# Patient Record
Sex: Male | Born: 1990 | Race: White | Hispanic: No | Marital: Single | Smoking: Current some day smoker
Health system: Southern US, Community
[De-identification: ages and names within clinical notes are randomized; demographics above are authoritative.]

## PROBLEM LIST (undated history)

## (undated) DIAGNOSIS — N200 Calculus of kidney: Secondary | ICD-10-CM

## (undated) DIAGNOSIS — I10 Essential (primary) hypertension: Secondary | ICD-10-CM

## (undated) DIAGNOSIS — F988 Other specified behavioral and emotional disorders with onset usually occurring in childhood and adolescence: Secondary | ICD-10-CM

## (undated) HISTORY — DX: Essential (primary) hypertension: I10

## (undated) HISTORY — DX: Other specified behavioral and emotional disorders with onset usually occurring in childhood and adolescence: F98.8

---
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
## 2010-05-05 ENCOUNTER — Emergency Department (HOSPITAL_COMMUNITY): Admission: EM | Admit: 2010-05-05 | Discharge: 2010-05-06 | Payer: Self-pay | Admitting: Emergency Medicine

## 2010-05-05 ENCOUNTER — Ambulatory Visit: Payer: Self-pay | Admitting: Family Medicine

## 2010-05-28 ENCOUNTER — Ambulatory Visit: Payer: Self-pay | Admitting: Family Medicine

## 2010-11-07 LAB — RAPID URINE DRUG SCREEN, HOSP PERFORMED
Barbiturates: NOT DETECTED
Cocaine: NOT DETECTED
Opiates: NOT DETECTED

## 2010-11-07 LAB — COMPREHENSIVE METABOLIC PANEL
ALT: 26 U/L (ref 0–53)
AST: 29 U/L (ref 0–37)
Alkaline Phosphatase: 74 U/L (ref 39–117)
CO2: 25 mEq/L (ref 19–32)
GFR calc non Af Amer: >60 mL/min (ref >60)
Glucose, Bld: 147 mg/dL — ABNORMAL HIGH (ref 70–99)
Potassium: 3.2 mEq/L — ABNORMAL LOW (ref 3.5–5.1)
Sodium: 132 mEq/L — ABNORMAL LOW (ref 135–145)
Total Protein: 7.7 g/dL (ref 6.0–8.3)

## 2010-11-07 LAB — URINALYSIS, ROUTINE W REFLEX MICROSCOPIC
Bilirubin Urine: NEGATIVE
Nitrite: NEGATIVE
Specific Gravity, Urine: 1.009 (ref 1.005–1.030)
Urobilinogen, UA: 0.2 mg/dL (ref 0.0–1.0)
pH: 6 (ref 5.0–8.0)

## 2010-11-07 LAB — CBC
MCV: 81.9 fL (ref 78.0–100.0)
Platelets: 295 10*3/uL (ref 150–400)
RBC: 5.25 MIL/uL (ref 4.22–5.81)
RDW: 12.6 % (ref 11.5–15.5)
WBC: 19.6 10*3/uL — ABNORMAL HIGH (ref 4.0–10.5)

## 2010-11-07 LAB — PROTIME-INR: Prothrombin Time: 12.1 seconds (ref 11.6–15.2)

## 2010-11-07 LAB — POCT CARDIAC MARKERS
CKMB, poc: <1 ng/mL — ABNORMAL LOW (ref 1.0–8.0)
Myoglobin, poc: 96.1 ng/mL (ref 12–200)
Troponin i, poc: <0.05 ng/mL (ref 0.00–0.09)
Troponin i, poc: <0.05 ng/mL (ref 0.00–0.09)

## 2010-11-07 LAB — POCT I-STAT, CHEM 8
BUN: 12 mg/dL (ref 6–23)
Chloride: 104 mEq/L (ref 96–112)
Creatinine, Ser: 1.2 mg/dL (ref 0.4–1.5)
Sodium: 138 mEq/L (ref 135–145)
TCO2: 24 mmol/L (ref 0–100)

## 2010-12-26 ENCOUNTER — Ambulatory Visit: Payer: Self-pay | Admitting: Family Medicine

## 2010-12-27 ENCOUNTER — Ambulatory Visit (INDEPENDENT_AMBULATORY_CARE_PROVIDER_SITE_OTHER): Payer: Managed Care, Other (non HMO) | Admitting: Medical

## 2010-12-27 DIAGNOSIS — J209 Acute bronchitis, unspecified: Secondary | ICD-10-CM

## 2010-12-27 DIAGNOSIS — F17200 Nicotine dependence, unspecified, uncomplicated: Secondary | ICD-10-CM

## 2011-04-03 ENCOUNTER — Other Ambulatory Visit: Payer: Self-pay | Admitting: *Deleted

## 2011-04-03 MED ORDER — LISINOPRIL 10 MG PO TABS: 10.0000 mg | ORAL_TABLET | Freq: Every day | ORAL | Status: DC

## 2011-04-03 NOTE — Telephone Encounter (Signed)
Called in Lisinopril 10 mg #30 with no refills to [facility] at [phone].  Pt has an appointment for an office visit 04-10-11 at 10 am.  CM, LPN

## 2011-04-09 ENCOUNTER — Encounter: Payer: Self-pay | Admitting: Family Medicine

## 2011-04-10 ENCOUNTER — Encounter: Payer: Managed Care, Other (non HMO) | Admitting: Family Medicine

## 2011-04-24 ENCOUNTER — Encounter: Payer: Self-pay | Admitting: Family Medicine

## 2011-04-24 ENCOUNTER — Ambulatory Visit (INDEPENDENT_AMBULATORY_CARE_PROVIDER_SITE_OTHER): Payer: Managed Care, Other (non HMO) | Admitting: Family Medicine

## 2011-04-24 VITALS — BP 120/80 | HR 70 | Wt 200.0 lb

## 2011-04-24 DIAGNOSIS — R030 Elevated blood-pressure reading, without diagnosis of hypertension: Secondary | ICD-10-CM

## 2011-04-24 NOTE — Patient Instructions (Signed)
Stop the lisinopril. Start an exercise program and make some dietary adjustments. Check her blood pressure once a week and return here with those readings in about 2 months

## 2011-04-24 NOTE — Progress Notes (Signed)
  Subjective:    Patient ID: Colin Sparks, male    DOB: [birth year], 20 y.o.   MRN: [id number]  HPI He is here for recheck on his blood pressure. He is on lisinopril however review of the record indicates he was placed on this medication after blood pressure readings over a two-week interval. He presently is having no difficulties. He admits to very little lifestyle modification in regard to diet and exercise.   Review of Systems     Objective:   Physical Exam Alert and in no distress. Blood pressure is recorded.      Assessment & Plan:  Elevated blood pressure. Doubt true hypertension. I again discussed the need for him to make diet and exercise changes. He'll stop his lisinopril and monitor his blood pressure weekly. Return here in 2 months for recheck.

## 2011-07-08 ENCOUNTER — Ambulatory Visit (INDEPENDENT_AMBULATORY_CARE_PROVIDER_SITE_OTHER): Payer: Managed Care, Other (non HMO) | Admitting: Family Medicine

## 2011-07-08 ENCOUNTER — Encounter: Payer: Self-pay | Admitting: Family Medicine

## 2011-07-08 VITALS — BP 130/90 | HR 70 | Wt 205.0 lb

## 2011-07-08 DIAGNOSIS — R030 Elevated blood-pressure reading, without diagnosis of hypertension: Secondary | ICD-10-CM

## 2011-07-08 NOTE — Progress Notes (Signed)
  Subjective:    Patient ID: Colin Sparks, male    DOB: 1991/07/22, 20 y.o.   MRN: [id number]  HPI He is here for blood pressure recheck. He has not made any diet or exercise changes.   Review of Systems     Objective:   Physical Exam  Alert and in no distress. Blood pressure is recorded      Assessment & Plan:  Elevated blood pressure.   I discussed diet and exercise with him and told him that I really did not want to place him on blood pressure medicine at this time. He is agreeable with this. He will attempt to make changes in his lifestyle. Return here in 3 months for recheck.

## 2011-07-08 NOTE — Patient Instructions (Signed)
Make some dietary changes and start an exercise regimen. Easiest thing to do with the diet is to cut back on carbs

## 2011-07-21 ENCOUNTER — Ambulatory Visit (INDEPENDENT_AMBULATORY_CARE_PROVIDER_SITE_OTHER): Payer: Managed Care, Other (non HMO) | Admitting: Medical

## 2011-07-21 ENCOUNTER — Encounter: Payer: Self-pay | Admitting: Medical

## 2011-07-21 DIAGNOSIS — R05 Cough: Secondary | ICD-10-CM

## 2011-07-21 DIAGNOSIS — R509 Fever, unspecified: Secondary | ICD-10-CM

## 2011-07-21 DIAGNOSIS — I10 Essential (primary) hypertension: Secondary | ICD-10-CM

## 2011-07-21 DIAGNOSIS — J111 Influenza due to unidentified influenza virus with other respiratory manifestations: Secondary | ICD-10-CM | POA: Insufficient documentation

## 2011-07-21 DIAGNOSIS — R059 Cough, unspecified: Secondary | ICD-10-CM

## 2011-07-21 NOTE — Progress Notes (Addendum)
Subjective:   HPI  Colin Sparks is a 20 y.o. male who presents with 2 to 3 day history headache, deep cough, chills, fever up to 102.6, chest congestion, and yellow sputum. This came on abruptly just a few days ago, runny nose, feels awful, feels achy all over.  Denies sick contacts.  No sore throat, ear pain, nausea or vomiting, or other symptoms.  No other aggravating or relieving factors.    No other c/o.  The following portions of the patient's history were reviewed and updated as appropriate: allergies, current medications, past family history, past medical history, past social history, past surgical history and problem list.  Past Medical History  Diagnosis Date  . ADD (attention deficit disorder)   . Hypertension     Review of Systems Constitutional: +fever, +chills, +sweats, -unexpected -weight change,-fatigue ENT: +runny nose, -ear pain, -sore throat Cardiology:  -chest pain, -palpitations, -edema Respiratory: +cough, -shortness of breath, -wheezing Gastroenterology: -abdominal pain, -nausea, -vomiting, -diarrhea, -constipation Hematology: -bleeding or bruising problems Musculoskeletal: -arthralgias, -myalgias, -joint swelling, -back pain Ophthalmology: -vision changes Urology: -dysuria, -difficulty urinating, -hematuria, -urinary frequency, -urgency Neurology: +headache, -weakness, -tingling, -numbness   Objective:   Filed Vitals:   07/21/11 1511  BP: 158/110  Pulse: 68  Temp: 98.4 F (36.9 C)    General appearance: Alert, WD/WN, no distress, ill appearing                             Skin: warm, flushed, somewhat diaphoretic                           Head: no sinus tenderness                            Eyes: conjunctiva normal, corneas clear, PERRLA                            Ears: pearly TMs, external ear canals normal                          Nose: septum midline, turbinates swollen, with erythema and clear discharge             Mouth/throat: MMM, tongue  normal, mild pharyngeal erythema                           Neck: supple, no adenopathy, no thyromegaly, nontender                          Heart: RRR, normal S1, S2, no murmurs                         Lungs: +bronchial breath sounds, no rhonchi, no wheezes, no rales                Extremities: no edema, nontender     Assessment and Plan:   Encounter Diagnoses  Name Primary?  . Influenza Yes  . Fever   . Cough   . Hypertension    Influenza A+.  Discussed diagnosis and treatment of influenza, contagious nature of illness, usual course, and possible complications.   Discussed supportive care, prevention.  Tylenol or Ibuprofen OTC for fever and malaise.  Wrote note for work, call/return if worse or not improving.  Call  Report 3-4 days. Handout given. Recheck on blood pressure in 1 week.

## 2011-07-21 NOTE — Patient Instructions (Signed)
Influenza, Adult Influenza ("the flu") is a viral infection of the respiratory tract. It causes chills, fever, cough, headache, body aches, and sore throat. Influenza in general will make you feel sicker than when you have a common cold. Symptoms of the illness typically last a few days. Cough and fatigue may continue for as long as 7 to 10 days. Influenza is highly contagious. It spreads easily to others in the droplets from coughs and sneezes. People frequently become infected by touching something that was recently contaminated with the virus and then touch their mouth, nose or eyes. This infection is caused by a virus. Symptoms will not be reduced or improved by taking an antibiotic. Antibiotics are medications that kill bacteria, not viruses. DIAGNOSIS  Diagnosis of influenza is often made based on the history and physical examination as well as the presence of influenza reports occurring in your community. Testing can be done if the diagnosis is not certain. TREATMENT  Since influenza is caused by a virus, antibiotics are not helpful. Your caregiver may prescribe antiviral medicines to shorten the illness and lessen the severity. Your caregiver may also recommend influenza vaccination and/or antiviral medicines for your family members in order to prevent the spread of influenza to them. HOME CARE INSTRUCTIONS  DO NOT GIVE ASPIRIN TO PERSONS WITH INFLUENZA WHO ARE UNDER 18 YEARS OF AGE. This could lead to brain and liver damage (Reye's syndrome). Read the label on over-the-counter medicines.   Stay home from work or school if at all possible until most of your symptoms are gone.   Only take over-the-counter or prescription medicines for pain, discomfort, or fever as directed by your caregiver.   Use a cool mist humidifier to increase air moisture. This will make breathing easier.   Rest until your temperature is nearly normal: 98.6 F (37 C). This usually takes 3 to 4 days. Be sure you get  plenty of rest.   Drink at least eight, eight-ounce glasses of fluids per day. Fluids include water, juice, broth, gelatin, or lemonade.   Cover your mouth and nose when coughing or sneezing and wash your hands often to prevent the spread of this virus to other persons.  PREVENTION  Annual influenza vaccination (flu shots) is the best way to avoid getting influenza. An annual flu shot is now routinely recommended for all adults in the [place] SEEK MEDICAL CARE IF:   You develop shortness of breath while resting.   You have a deep cough with production of mucous or chest pain.   You develop nausea (feeling sick to your stomach), vomiting, or diarrhea.  SEEK IMMEDIATE MEDICAL CARE IF:   You have difficulty breathing, become short of breath, or your skin or nails turn bluish.   You develop severe neck pain or stiffness.   You develop a severe headache, facial pain, or earache.   You have a fever.   You develop nausea or vomiting that cannot be controlled.  Document Released: 08/08/2000 Document Revised: 04/23/2011 Document Reviewed: 06/13/2009 [employer] Patient Information 2012 [employer]. 

## 2011-07-22 LAB — POCT INFLUENZA A/B
Influenza A, POC: POSITIVE
Influenza B, POC: NEGATIVE

## 2011-07-22 NOTE — Progress Notes (Signed)
Addended by: Janeice Robinson on: 07/22/2011 04:21 PM   Modules accepted: Orders

## 2011-10-08 ENCOUNTER — Ambulatory Visit: Payer: Managed Care, Other (non HMO) | Admitting: Family Medicine

## 2011-12-11 ENCOUNTER — Ambulatory Visit (INDEPENDENT_AMBULATORY_CARE_PROVIDER_SITE_OTHER): Payer: BC Managed Care – PPO | Admitting: Medical

## 2011-12-11 ENCOUNTER — Encounter: Payer: Self-pay | Admitting: Medical

## 2011-12-11 VITALS — BP 160/100 | HR 80 | Temp 98.1°F | Wt 200.0 lb

## 2011-12-11 DIAGNOSIS — S99929A Unspecified injury of unspecified foot, initial encounter: Secondary | ICD-10-CM

## 2011-12-11 DIAGNOSIS — M25579 Pain in unspecified ankle and joints of unspecified foot: Secondary | ICD-10-CM

## 2011-12-11 DIAGNOSIS — Z720 Tobacco use: Secondary | ICD-10-CM

## 2011-12-11 DIAGNOSIS — I10 Essential (primary) hypertension: Secondary | ICD-10-CM | POA: Insufficient documentation

## 2011-12-11 DIAGNOSIS — S99919A Unspecified injury of unspecified ankle, initial encounter: Secondary | ICD-10-CM

## 2011-12-11 DIAGNOSIS — F17200 Nicotine dependence, unspecified, uncomplicated: Secondary | ICD-10-CM

## 2011-12-11 MED ORDER — NAPROXEN 500 MG PO TABS: 500.0000 mg | ORAL_TABLET | Freq: Two times a day (BID) | ORAL | Status: DC

## 2011-12-11 MED ORDER — LISINOPRIL-HYDROCHLOROTHIAZIDE 10-12.5 MG PO TABS: 1.0000 | ORAL_TABLET | Freq: Every day | ORAL | Status: DC

## 2011-12-11 NOTE — Patient Instructions (Signed)
Ankle sprain - RICE - rest, ice compression and elevation  Rest by staying off the ankle, use crutches when walking  Ice 2-4 times daily  Compression with ACE wrap or sleeve  Elevate the leg  Begin Naprosyn 500mg  twice daily the next 5-7 days for pain and inflammation  In 5-7 days as pain subsides, start doing towel exercises   Hypertension - we will start you on medication today to control your blood pressure.  The medication is Lisinopril/HCT, once daily.  I want to see you back in 10-14 days to recheck both ankle and blood pressure and we will need to do some other tests at this time.     Hypertension As your heart beats, it forces blood through your arteries. This force is your blood pressure. If the pressure is too high, it is called hypertension (HTN) or high blood pressure. HTN is dangerous because you may have it and not know it. High blood pressure may mean that your heart has to work harder to pump blood. Your arteries may be narrow or stiff. The extra work puts you at risk for heart disease, stroke, and other problems.  Blood pressure consists of two numbers, a higher number over a lower, 110/72, for example. It is stated as "110 over 72." The ideal is below 120 for the top number (systolic) and under 80 for the bottom (diastolic). Write down your blood pressure today. You should pay close attention to your blood pressure if you have certain conditions such as:  Heart failure.   Prior heart attack.   Diabetes   Chronic kidney disease.   Prior stroke.   Multiple risk factors for heart disease.  To see if you have HTN, your blood pressure should be measured while you are seated with your arm held at the level of the heart. It should be measured at least twice. A one-time elevated blood pressure reading (especially in the Emergency Department) does not mean that you need treatment. There may be conditions in which the blood pressure is different between your right and left  arms. It is important to see your caregiver soon for a recheck. Most people have essential hypertension which means that there is not a specific cause. This type of high blood pressure may be lowered by changing lifestyle factors such as:  Stress.   Smoking.   Lack of exercise.   Excessive weight.   Drug/tobacco/alcohol use.   Eating less salt.  Most people do not have symptoms from high blood pressure until it has caused damage to the body. Effective treatment can often prevent, delay or reduce that damage. TREATMENT  When a cause has been identified, treatment for high blood pressure is directed at the cause. There are a large number of medications to treat HTN. These fall into several categories, and your caregiver will help you select the medicines that are best for you. Medications may have side effects. You should review side effects with your caregiver. If your blood pressure stays high after you have made lifestyle changes or started on medicines,   Your medication(s) may need to be changed.   Other problems may need to be addressed.   Be certain you understand your prescriptions, and know how and when to take your medicine.   Be sure to follow up with your caregiver within the time frame advised (usually within two weeks) to have your blood pressure rechecked and to review your medications.   If you are taking more than one  medicine to lower your blood pressure, make sure you know how and at what times they should be taken. Taking two medicines at the same time can result in blood pressure that is too low.  SEEK IMMEDIATE MEDICAL CARE IF:  You develop a severe headache, blurred or changing vision, or confusion.   You have unusual weakness or numbness, or a faint feeling.   You have severe chest or abdominal pain, vomiting, or breathing problems.  MAKE SURE YOU:   Understand these instructions.   Will watch your condition.   Will get help right away if you are not  doing well or get worse.  Document Released: 08/11/2005 Document Revised: 07/31/2011 Document Reviewed: 03/31/2008 [facility] Patient Information 2012 [place], [place].

## 2011-12-11 NOTE — Progress Notes (Signed)
Subjective: Here for left ankle pain.  Stepped off 3 ft porch yesterday, landed awkward on left foot, felt pain and since has had pain, swelling, can't walk much on the foot today.  He typically favors left foot due to high school right ankle injury.  Pain over left ankle outside and arch of foot.  Using OTC NSAID for pain.   No Known Allergies  Current Outpatient Prescriptions on File Prior to Visit  Medication Sig Dispense Refill  . lisinopril-hydrochlorothiazide (PRINZIDE,ZESTORETIC) 10-12.5 MG per tablet Take 1 tablet by mouth daily.  30 tablet  3    Past Medical History  Diagnosis Date  . ADD (attention deficit disorder)   . Hypertension     History reviewed. No pertinent past surgical history.  Family History  Problem Relation Age of Onset  . Arthritis Father   . Heart disease Father   . Gout Father   . Mental illness Maternal Aunt   . Mental illness Paternal Aunt   . Cancer Paternal Uncle   . Diabetes Maternal Grandmother   . Hypertension Maternal Grandmother   . Heart disease Maternal Grandfather   . Heart disease Paternal Grandmother   . Hypertension Paternal Grandmother   . Cancer Paternal Grandfather   . Diabetes Paternal Grandfather   . Hypertension Paternal Grandfather     History   Social History  . Marital Status: Single    Spouse Name: N/A    Number of Children: N/A  . Years of Education: N/A   Occupational History  . Not on file.   Social History Main Topics  . Smoking status: Current Everyday Smoker  . Smokeless tobacco: Never Used  . Alcohol Use: Not on file  . 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 Sexually Active: Not on file   Other Topics Concern  . Not on file   Social History Narrative  . No narrative on file    Reviewed their medical, surgical, family, social, medication, and allergy history and updated chart as appropriate.    Review of Systems Constitutional: -fever, -chills, -sweats,  -unexpected -weight change,-fatigue Cardiology:  -chest pain, -palpitations, -edema Respiratory: -cough, -shortness of breath, -wheezing Gastroenterology: -abdominal pain, -nausea, -vomiting, -diarrhea, -constipation Ophthalmology: -vision changes Urology: -dysuria, -difficulty urinating, -hematuria, -urinary frequency, -urgency Neurology: -headache, -weakness, -tingling, -numbness    Objective:   Physical Exam  Filed Vitals:   12/11/11 1531  BP: 160/100  Pulse: 80  Temp: 98.1 F (36.7 C)    General appearance: alert, no distress, WD/WN, overweight white male, flushed in face as usual Oral cavity: MMM, no lesions Neck: supple, no lymphadenopathy, no thyromegaly, no masses, no bruit Heart: RRR, normal S1, S2, no murmurs Lungs: CTA bilaterally, no wheezes, rhonchi, or rales Abdomen: +bs, soft, non tender, non distended, no masses, no hepatomegaly, no splenomegaly Pulses: 2+ symmetric, upper and lower extremities, normal cap refill MSK: tender left lateral ankle over malleolus, swelling generalized of lateral ankle, tender ATFL, decreased eversion, otherwise bilat foot/ankle exam unremarkable Neuro: nonfocal, intact   Assessment and Plan :    Encounter Diagnoses  Name Primary?  . Essential hypertension, benign Yes  . Ankle pain   . Ankle injury   . Tobacco use    HTN - based on today and past BP readings, will begin medication today.  Lisinopril/HCT.  Recheck 2wk, labs and further eval at that time.  Discussed risks of uncontrolled HTN.   Ankle pain - ankle  sprain.  Xray negative for fracture of abnormality.   Advised RICE, crutches, script for naprosyn, discussed towel exercise once pain and swelling subsides in a week, and recheck 2 wk.  Tobacco use - Patient was encouraged to quit smoking.  Discussed risks of smoking.    Follow-up  2wk

## 2011-12-24 ENCOUNTER — Ambulatory Visit (INDEPENDENT_AMBULATORY_CARE_PROVIDER_SITE_OTHER): Payer: BC Managed Care – PPO | Admitting: Medical

## 2011-12-24 ENCOUNTER — Encounter: Payer: Self-pay | Admitting: Medical

## 2011-12-24 VITALS — BP 142/70 | HR 78 | Temp 98.2°F | Resp 16 | Wt 201.0 lb

## 2011-12-24 DIAGNOSIS — I10 Essential (primary) hypertension: Secondary | ICD-10-CM

## 2011-12-24 DIAGNOSIS — F1910 Other psychoactive substance abuse, uncomplicated: Secondary | ICD-10-CM

## 2011-12-24 DIAGNOSIS — Z720 Tobacco use: Secondary | ICD-10-CM

## 2011-12-24 DIAGNOSIS — E663 Overweight: Secondary | ICD-10-CM | POA: Insufficient documentation

## 2011-12-24 DIAGNOSIS — F1911 Other psychoactive substance abuse, in remission: Secondary | ICD-10-CM

## 2011-12-24 DIAGNOSIS — F17200 Nicotine dependence, unspecified, uncomplicated: Secondary | ICD-10-CM

## 2011-12-24 NOTE — Progress Notes (Signed)
Subjective:   HPI  Colin Sparks is a 21 y.o. male who presents for 2 wk recheck on ankle and blood pressure.  Last visit we diagnosed him with hypertension, started him on medication.  No current c/o or medication side effects. He is non fasting today. His ankle is much improved with rest, elevation, ice, and he is doing towel rehab exercises.    He does snore but no hx/o apnea.  He is still smoking.  No other aggravating or relieving factors.    No other c/o.  The following portions of the patient's history were reviewed and updated as appropriate: allergies, current medications, past family history, past medical history, past social history, past surgical history and problem list.  Past Medical History  Diagnosis Date  . ADD (attention deficit disorder)   . Hypertension     No Known Allergies   Review of Systems ROS reviewed and was negative other than noted in HPI or above.    Objective:   Physical Exam  General appearance: alert, no distress, WD/WN, overweight white male Skin: always appears flushed in the face Neck: supple, no lymphadenopathy, no thyromegaly, no masses, no bruits Heart: RRR, normal S1, S2, no murmurs Lungs: CTA bilaterally, no wheezes, rhonchi, or rales Abdomen: +bs, soft, non tender, non distended, no masses, no hepatomegaly, no splenomegaly, no bruits Pulses: 2+ symmetric, upper and lower extremities, normal cap refill   Adult ECG Report  Indication: new onset hypertension in 20yo  Rate: 72bpm  Rhythm: normal sinus rhythm  QRS Axis: 54 degrees  PR Interval:  QRS Duration: 94ms  QTc:  Conduction Disturbances: none  Other Abnormalities: none  Patient's cardiac risk factors are: hypertension and male gender.  EKG comparison: none  Narrative Interpretation: nonspecific T wave abnormality lead III, otherwise normal EKG   Assessment and Plan :     Encounter Diagnoses  Name Primary?  . Essential hypertension, benign Yes  .  Overweight   . Tobacco use disorder   . History of substance abuse    Discussed risk of HTN, discussed secondary causes, possible workup.  He will return for fasting labs.  C/t Lisinopril HCT started 2 wk ago.  May need cardiology consult, but we will get labs next.

## 2011-12-24 NOTE — Patient Instructions (Signed)

## 2011-12-25 ENCOUNTER — Encounter: Payer: Self-pay | Admitting: Medical

## 2011-12-31 ENCOUNTER — Other Ambulatory Visit: Payer: BC Managed Care – PPO

## 2012-04-20 ENCOUNTER — Encounter: Payer: Self-pay | Admitting: Family Medicine

## 2012-04-20 ENCOUNTER — Ambulatory Visit (INDEPENDENT_AMBULATORY_CARE_PROVIDER_SITE_OTHER): Payer: BC Managed Care – PPO | Admitting: Family Medicine

## 2012-04-20 VITALS — BP 130/80 | HR 70 | Wt 208.0 lb

## 2012-04-20 DIAGNOSIS — I10 Essential (primary) hypertension: Secondary | ICD-10-CM

## 2012-04-20 DIAGNOSIS — E663 Overweight: Secondary | ICD-10-CM

## 2012-04-20 MED ORDER — LISINOPRIL-HYDROCHLOROTHIAZIDE 10-12.5 MG PO TABS: 1.0000 | ORAL_TABLET | Freq: Every day | ORAL | Status: DC

## 2012-04-20 NOTE — Progress Notes (Signed)
  Subjective:    Patient ID: Colin Sparks, male    DOB: [birth year], 21 y.o.   MRN: [id number]  HPI He is here for medication check. He continues on blood pressure medication. He has recently made major changes in his life and is now starting a diet and exercise program. He has cut out alcohol, drugs of all kinds. 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 Continues in recovery and did have a recent setback that now is clean again.   Review of Systems     Objective:   Physical Exam Alert and in no distress. Blood pressure is recorded       Assessment & Plan:   1. Essential hypertension, benign  lisinopril-hydrochlorothiazide (PRINZIDE,ZESTORETIC) 10-12.5 MG per tablet  2. Overweight     I encouraged him to continue with his diet and exercise regimen. Also encouraged him to stay in the recovery program. Discussed weight loss with him and recommend he get down to a waist size of 30-32. Discussed possibly stopping his blood pressure medicine if he loses approximately 20 pounds but encouraged him to continue with that weight loss.

## 2012-08-20 ENCOUNTER — Ambulatory Visit
Admission: RE | Admit: 2012-08-20 | Discharge: 2012-08-20 | Disposition: A | Discharge status: Home / Self Care | Payer: BC Managed Care – PPO | Source: Ambulatory Visit | Attending: Medical | Admitting: Medical

## 2012-08-20 ENCOUNTER — Ambulatory Visit (INDEPENDENT_AMBULATORY_CARE_PROVIDER_SITE_OTHER): Payer: BC Managed Care – PPO | Admitting: Medical

## 2012-08-20 ENCOUNTER — Telehealth: Payer: Self-pay | Admitting: Family Medicine

## 2012-08-20 ENCOUNTER — Other Ambulatory Visit: Payer: Self-pay | Admitting: Medical

## 2012-08-20 ENCOUNTER — Encounter: Payer: Self-pay | Admitting: Medical

## 2012-08-20 VITALS — BP 130/80 | HR 57 | Temp 97.6°F | Wt 212.0 lb

## 2012-08-20 DIAGNOSIS — N23 Unspecified renal colic: Secondary | ICD-10-CM

## 2012-08-20 DIAGNOSIS — R112 Nausea with vomiting, unspecified: Secondary | ICD-10-CM

## 2012-08-20 LAB — BASIC METABOLIC PANEL
BUN: 15 mg/dL (ref 6–23)
Chloride: 99 mEq/L (ref 96–112)
Creat: 0.94 mg/dL (ref 0.50–1.35)

## 2012-08-20 LAB — CBC WITH DIFFERENTIAL/PLATELET
Basophils Absolute: 0.1 10*3/uL (ref 0.0–0.1)
Basophils Relative: 0 % (ref 0–1)
Eosinophils Relative: 2 % (ref 0–5)
HCT: 46 % (ref 39.0–52.0)
Lymphocytes Relative: 14 % (ref 12–46)
MCHC: 36.3 g/dL — ABNORMAL HIGH (ref 30.0–36.0)
Monocytes Absolute: 0.9 10*3/uL (ref 0.1–1.0)
Neutro Abs: 12.9 10*3/uL — ABNORMAL HIGH (ref 1.7–7.7)
Platelets: 327 10*3/uL (ref 150–400)
RDW: 13.6 % (ref 11.5–15.5)
WBC: 16.3 10*3/uL — ABNORMAL HIGH (ref 4.0–10.5)

## 2012-08-20 LAB — POCT URINALYSIS DIPSTICK
Leukocytes, UA: NEGATIVE
Urobilinogen, UA: NEGATIVE
pH, UA: 5

## 2012-08-20 MED ORDER — PROMETHAZINE HCL 25 MG PO TABS: 25.0000 mg | ORAL_TABLET | Freq: Four times a day (QID) | ORAL | Status: DC | PRN

## 2012-08-20 MED ORDER — HYDROCODONE-ACETAMINOPHEN 7.5-325 MG PO TABS: 1.0000 | ORAL_TABLET | Freq: Four times a day (QID) | ORAL | Status: DC | PRN

## 2012-08-20 MED ORDER — TAMSULOSIN HCL 0.4 MG PO CAPS: 0.4000 mg | ORAL_CAPSULE | Freq: Every day | ORAL | Status: DC

## 2012-08-20 NOTE — Progress Notes (Signed)
Subjective:   HPI  Colin Sparks is a 21 y.o. male who presents for possible kidney stone.  He notes a week ago with dull pain in left kidney area.  Started increasing water intake.  Pain went away, went back to drinking soda over the holidays, but then again this morning had severe left flank pain, vomited a few times.  No hx/o stones.  This morning the pain was sharp and stabbing, excruciating pain.  Took 2 Advil this morning which did help some, but the pain is returning and feeling nauseated again.  His dad apparently has hx/o frequent renal stones.   No other aggravating or relieving factors.    No other c/o.  The following portions of the patient's history were reviewed and updated as appropriate: allergies, current medications, past family history, past medical history, past social history, past surgical history and problem list.  Past Medical History  Diagnosis Date  . ADD (attention deficit disorder)   . Hypertension     No Known Allergies  Review of Systems Constitutional: -fever, -chills, -sweats ENT: -runny nose, -ear pain, -sore throat Cardiology:  -chest pain, -palpitations, -edema Respiratory: -cough, -shortness of breath, -wheezing Gastroenterology: -abdominal pain, -nausea, -vomiting, -diarrhea, -constipation  Musculoskeletal: -arthralgias, -myalgias, -joint swelling Urology: -dysuria, -difficulty urinating, -hematuria, -some mild urinary frequency, +some mild urgency, no penile discharge Neurology: -headache, -weakness, -tingling, -numbness       Objective:   Physical Exam  General appearance: alert, no distress, WD/WN, in pain Heart: RRR, normal S1, S2, no murmurs Lungs: CTA bilaterally, no wheezes, rhonchi, or rales Abdomen: +bs, soft, non tender, non distended, no masses, no hepatomegaly, no splenomegaly Back: nontender to palpation or percussion  Assessment and Plan :     Encounter Diagnoses  Name Primary?  . Renal colic on left side Yes  . Nausea &  vomiting    Send for CT abdomen/pelvis today.   Labs today.  UA today with microscopic blood and protein, otherwise unremarkable.  Will treat for renal stone as likely diagnosis.  Of note he had small non obstructing stone of left kidney per CT abdomen/pelvis 2011.   Advised rest, increase water intake, strain urine and collect any hard stony debris.  Gave strainer and urine cup.  Begin Lortab for pain prn, Phenergan prn for nausea and vomiting.  discussed risks of medication.  If worsening over the weekend, go to the ED.

## 2012-08-20 NOTE — Patient Instructions (Signed)
Ureteral Colic (Kidney Stones) Ureteral colic is the result of a condition when kidney stones form inside the kidney. Once kidney stones are formed they may move into the tube that connects the kidney with the bladder (ureter). If this occurs, this condition may cause pain (colic) in the ureter.  CAUSES  Pain is caused by stone movement in the ureter and the obstruction caused by the stone. SYMPTOMS  The pain comes and goes as the ureter contracts around the stone. The pain is usually intense, sharp, and stabbing in character. The location of the pain may move as the stone moves through the ureter. When the stone is near the kidney the pain is usually located in the back and radiates to the belly (abdomen). When the stone is ready to pass into the bladder the pain is often located in the lower abdomen on the side the stone is located. At this location, the symptoms may mimic those of a urinary tract infection with urinary frequency. Once the stone is located here it often passes into the bladder and the pain disappears completely. TREATMENT   Your caregiver will provide you with medicine for pain relief.  You may require specialized follow-up X-rays.  The absence of pain does not always mean that the stone has passed. It may have just stopped moving. If the urine remains completely obstructed, it can cause loss of kidney function or even complete destruction of the involved kidney. It is your responsibility and in your interest that X-rays and follow-ups as suggested by your caregiver are completed. Relief of pain without passage of the stone can be associated with severe damage to the kidney, including loss of kidney function on that side.  If your stone does not pass on its own, additional measures may be taken by your caregiver to ensure its removal. HOME CARE INSTRUCTIONS   Increase your fluid intake. Water is the preferred fluid since juices containing vitamin C may acidify the urine making it  less likely for certain stones (uric acid stones) to pass.  Strain all urine. A strainer will be provided. Keep all particulate matter or stones for your caregiver to inspect.  Take your pain medicine as directed.  Make a follow-up appointment with your caregiver as directed.  Remember that the goal is passage of your stone. The absence of pain does not mean the stone is gone. Follow your caregiver's instructions.  Only take over-the-counter or prescription medicines for pain, discomfort, or fever as directed by your caregiver. SEEK MEDICAL CARE IF:   Pain cannot be controlled with the prescribed medicine.  You have a fever.  Pain continues for longer than your caregiver advises it should.  There is a change in the pain, and you develop chest discomfort or constant abdominal pain.  You feel faint or pass out. MAKE SURE YOU:   Understand these instructions.  Will watch your condition.  Will get help right away if you are not doing well or get worse. Document Released: 05/21/2005 Document Revised: 11/03/2011 Document Reviewed: 02/05/2011 [facility] Patient Information 2013 [place], [place].

## 2012-08-20 NOTE — Telephone Encounter (Signed)
Patient is aware of his Ct abdomen/pelvis w/o contrast [place] on 08/20/12 @ 145 pm. CLS Per Certification number [id number]

## 2012-08-27 ENCOUNTER — Telehealth: Payer: Self-pay | Admitting: Family Medicine

## 2012-08-27 NOTE — Telephone Encounter (Signed)
Patient is aware of his appointment at [facility] on 09/20/12 to see Dr. Brunilda Payor. CLS [phone]

## 2012-09-06 ENCOUNTER — Encounter (HOSPITAL_COMMUNITY): Payer: Self-pay | Admitting: *Deleted

## 2012-09-06 ENCOUNTER — Emergency Department (HOSPITAL_COMMUNITY): Payer: BC Managed Care – PPO

## 2012-09-06 ENCOUNTER — Emergency Department (HOSPITAL_COMMUNITY)
Admission: EM | Admit: 2012-09-06 | Discharge: 2012-09-06 | Disposition: A | Discharge status: Home / Self Care | Payer: BC Managed Care – PPO | Attending: Emergency Medicine | Admitting: Emergency Medicine

## 2012-09-06 DIAGNOSIS — F988 Other specified behavioral and emotional disorders with onset usually occurring in childhood and adolescence: Secondary | ICD-10-CM | POA: Insufficient documentation

## 2012-09-06 DIAGNOSIS — Z87442 Personal history of urinary calculi: Secondary | ICD-10-CM

## 2012-09-06 DIAGNOSIS — I10 Essential (primary) hypertension: Secondary | ICD-10-CM | POA: Insufficient documentation

## 2012-09-06 DIAGNOSIS — R109 Unspecified abdominal pain: Secondary | ICD-10-CM

## 2012-09-06 DIAGNOSIS — Z87891 Personal history of nicotine dependence: Secondary | ICD-10-CM | POA: Insufficient documentation

## 2012-09-06 HISTORY — DX: Calculus of kidney: N20.0

## 2012-09-06 LAB — URINALYSIS, ROUTINE W REFLEX MICROSCOPIC
Leukocytes, UA: NEGATIVE
Nitrite: NEGATIVE
Protein, ur: NEGATIVE mg/dL
Specific Gravity, Urine: 1.005 (ref 1.005–1.030)
Urobilinogen, UA: 0.2 mg/dL (ref 0.0–1.0)

## 2012-09-06 LAB — URINE MICROSCOPIC-ADD ON

## 2012-09-06 MED ORDER — KETOROLAC TROMETHAMINE 30 MG/ML IJ SOLN
30.0000 mg | Freq: Once | INTRAMUSCULAR | Status: AC
  Administered 2012-09-06: 30 mg via INTRAMUSCULAR
  Filled 2012-09-06: qty 1

## 2012-09-06 MED ORDER — ONDANSETRON 4 MG PO TBDP
4.0000 mg | ORAL_TABLET | Freq: Once | ORAL | Status: AC
  Administered 2012-09-06: 4 mg via ORAL
  Filled 2012-09-06: qty 1

## 2012-09-06 MED ORDER — IBUPROFEN 800 MG PO TABS: 800.0000 mg | ORAL_TABLET | Freq: Three times a day (TID) | ORAL | Status: DC | PRN

## 2012-09-06 NOTE — ED Notes (Signed)
Pt arrived from home c/o left flank pain. States feels like kidney stone. Pt recently seen late December and diagnosed with kidney stone. Pt states urge to void, but unable to make volume.

## 2012-09-06 NOTE — ED Provider Notes (Signed)
History     CSN: [id number]  Arrival date & time 09/06/12  4010   First MD Initiated Contact with Patient 09/06/12 0532      Chief Complaint  Patient presents with  . Flank Pain    (Consider location/radiation/quality/duration/timing/severity/associated sxs/prior treatment) HPIBryan Colin Sparks is a 22 y.o. male patient was diagnosed with a 4 mm kidney stone she's not sure that he passed, he's been feeling fine until last night when he had shut in sharp left flank pain that radiated into his groin, he took one Norco tablet and he says he's feeling better with that but still having some residual pain. No hematuria, no dysuria, no frequency, no fevers or chills. No other abdominal pain. No chest pain or shortness of breath.   Past Medical History  Diagnosis Date  . ADD (attention deficit disorder)   . Hypertension   . Kidney stone     No past surgical history on file.  Family History  Problem Relation Age of Onset  . Arthritis Father   . Heart disease Father   . Gout Father   . Mental illness Maternal Aunt   . Mental illness Paternal Aunt   . Cancer Paternal Uncle   . Diabetes Maternal Grandmother   . Hypertension Maternal Grandmother   . Heart disease Maternal Grandfather   . Heart disease Paternal Grandmother   . Hypertension Paternal Grandmother   . Cancer Paternal Grandfather   . Diabetes Paternal Grandfather   . Hypertension Paternal Grandfather     History  Substance Use Topics  . Smoking status: Former Smoker    Quit date: 03/30/2012  . Smokeless tobacco: Never Used  . Alcohol Use: No      Review of Systems At least 10pt or greater review of systems completed and are negative except where specified in the HPI.  Allergies  Review of patient's allergies indicates no known allergies.  Home Medications   Current Outpatient Rx  Name  Route  Sig  Dispense  Refill  . HYDROCODONE-ACETAMINOPHEN 7.5-325 MG PO TABS   Oral   Take 1 tablet by mouth every 6  (six) hours as needed for pain.   20 tablet   0   . LISINOPRIL-HYDROCHLOROTHIAZIDE 10-12.5 MG PO TABS   Oral   Take 1 tablet by mouth daily.   30 tablet   11   . TAMSULOSIN HCL 0.4 MG PO CAPS   Oral   Take 1 capsule (0.4 mg total) by mouth daily.   30 capsule   0   . IBUPROFEN 800 MG PO TABS   Oral   Take 1 tablet (800 mg total) by mouth every 8 (eight) hours as needed for pain.   21 tablet   0     BP 132/71  Pulse 110  Temp 98.2 F (36.8 C) (Oral)  Resp 18  SpO2 99%  Physical Exam  Nursing notes reviewed.  Electronic medical record reviewed. VITAL SIGNS:   Filed Vitals:   09/06/12 0417 09/06/12 0641  BP: 155/89 132/71  Pulse: 113 110  Temp: 97.8 F (36.6 C) 98.2 F (36.8 C)  TempSrc: Oral Oral  Resp: 18 18  SpO2: 96% 99%   CONSTITUTIONAL: Awake, oriented, appears non-toxic HENT: Atraumatic, normocephalic, oral mucosa pink and moist, airway patent. Nares patent without drainage. External ears normal. EYES: Conjunctiva clear, EOMI, PERRLA NECK: Trachea midline, non-tender, supple CARDIOVASCULAR: Normal heart rate, Normal rhythm, No murmurs, rubs, gallops PULMONARY/CHEST: Clear to auscultation, no rhonchi, wheezes, or rales. Symmetrical  breath sounds. Non-tender. ABDOMINAL: Non-distended, soft, non-tender - no rebound or guarding.  BS normal. Mild tenderness to percussion in the left flank. NEUROLOGIC: Non-focal, moving all four extremities, no gross sensory or motor deficits. EXTREMITIES: No clubbing, cyanosis, or edema SKIN: Warm, Dry, No erythema, No rash  ED Course  Procedures (including critical care time)  Labs Reviewed  URINALYSIS, ROUTINE W REFLEX MICROSCOPIC - Abnormal; Notable for the following:    Hgb urine dipstick TRACE (*)     All other components within normal limits  URINE MICROSCOPIC-ADD ON   Dg Abd 1 View  09/06/2012  *RADIOLOGY REPORT*  Clinical Data: Left-sided kidney stone with new left-sided pain.  ABDOMEN - 1 VIEW  Comparison: CT  08/20/2012  Findings: Scattered gas and stool in the colon.  No small or large bowel distension.  No radiopaque stones identified.  However, the previous left renal stone on CT was not identified on the scout view and nonvisualization today may not necessarily indicate that the stone is no longer present.  Visualized bones appear intact.  IMPRESSION: Nonobstructive bowel gas pattern.  No radiopaque stones are identified.   Original Report Authenticated By: Burman Nieves, M.D.      1. Flank pain   2. History of kidney stones       MDM  Colin Sparks is a 22 y.o. male with a history of kidney stones per last CT, had a 4 mm stone, says he is not sure if he passed it or not. He was on about 5-7 days of Flomax and has been asymptomatic for the last week.  Check A1c U. upright abdomen x-ray, no stone is seen, patient is asymptomatic of treatment with shock Toradol. His urinalysis shows 0-2 red blood cells. I feel that he is likely having ureteral spasm - and he can followup with his urologist later this month.  I explained the diagnosis and have given explicit precautions to return to the ER including any other new or worsening symptoms. The patient understands and accepts the medical plan as it's been dictated and I have answered their questions. Discharge instructions concerning home care and prescriptions have been given.  The patient is STABLE and is discharged to home in good condition.           Jones Skene, MD 09/06/12 [phone]

## 2012-10-09 ENCOUNTER — Other Ambulatory Visit: Payer: Self-pay | Admitting: Medical

## 2012-10-11 NOTE — Telephone Encounter (Signed)
Is this okay to refill? 

## 2013-05-19 ENCOUNTER — Telehealth: Payer: Self-pay | Admitting: Medical

## 2013-05-19 MED ORDER — LISINOPRIL-HYDROCHLOROTHIAZIDE 10-12.5 MG PO TABS: 1.0000 | ORAL_TABLET | Freq: Every day | ORAL | Status: DC

## 2013-05-19 NOTE — Telephone Encounter (Signed)
SENT BP MED IN PER JCL

## 2013-05-24 ENCOUNTER — Encounter: Payer: Self-pay | Admitting: Medical

## 2013-06-27 ENCOUNTER — Encounter: Payer: Self-pay | Admitting: Family Medicine

## 2013-06-27 ENCOUNTER — Ambulatory Visit (INDEPENDENT_AMBULATORY_CARE_PROVIDER_SITE_OTHER): Payer: BC Managed Care – PPO | Admitting: Family Medicine

## 2013-06-27 VITALS — BP 130/80 | HR 78 | Wt 190.0 lb

## 2013-06-27 DIAGNOSIS — Z23 Encounter for immunization: Secondary | ICD-10-CM

## 2013-06-27 DIAGNOSIS — I10 Essential (primary) hypertension: Secondary | ICD-10-CM

## 2013-06-27 MED ORDER — LISINOPRIL-HYDROCHLOROTHIAZIDE 10-12.5 MG PO TABS: 1.0000 | ORAL_TABLET | Freq: Every day | ORAL | Status: DC

## 2013-06-27 NOTE — Progress Notes (Signed)
  Subjective:    Patient ID: Colin Sparks, male    DOB: 1991/01/10, 22 y.o.   MRN: [id number]  HPI Her blood pressure recheck. He continues on the medication listed in the chart. He also has lost a considerable amount of weight. He has done this through proper channels. He also has turned his life around. He attributes this to his parents lowering the boom on him. His work is going well. He is being considered for advancement. He does not have his college degree at.   Review of Systems     Objective:   Physical Exam Alert and in no distress. Blood pressure is recorded.       Assessment & Plan:  Hypertension - Plan: lisinopril-hydrochlorothiazide (PRINZIDE,ZESTORETIC) 10-12.5 MG per tablet  Need for prophylactic vaccination and inoculation against influenza - Plan: Flu Vaccine QUAD 36+ mos IM  strongly encouraged him to get at least his associates degree he cannot limit his options in the future. He will definitely look into this. Also discussed continued weight loss with him and the benefits it has including possibly stopping his blood pressure medication.

## 2013-06-27 NOTE — Patient Instructions (Signed)
Get your damn degree

## 2013-10-02 IMAGING — CT CT ABD-PELV W/O CM
2 of 4 series · 13 of 32 positions shown, 18 images · non-contrast
Comparison: Abdominal pelvic CT 05/05/2010.

CLINICAL DATA: Intermittent left flank pain for 1 week, severe
today.  Microhematuria.

CT ABDOMEN AND PELVIS WITHOUT CONTRAST
TECHNIQUE: Multidetector CT imaging of the abdomen and pelvis was
performed following the standard protocol without intravenous
contrast.

[Series 2: renal stone w/o · axial · non-contrast · 0.78mm/px · z∈[-338,-83]mm · 5 of 77 slices shown, 10 images]
[im 13/77  soft-tissue]
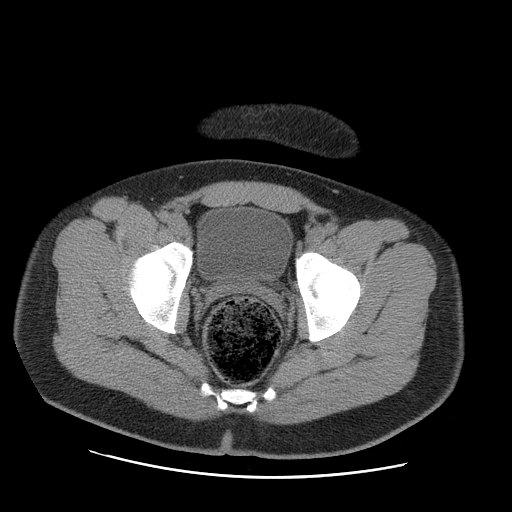
[im 13/77  bone]
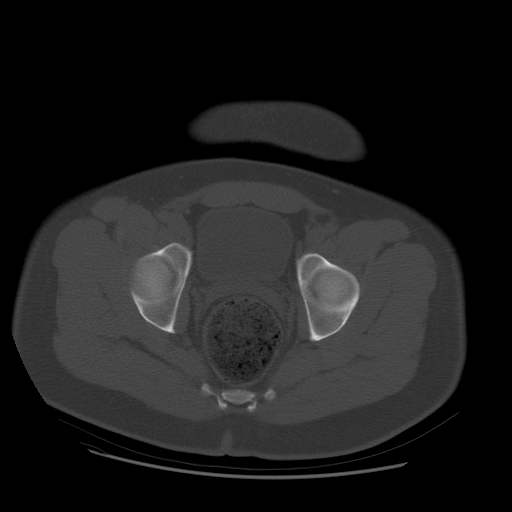
[im 26/77  soft-tissue]
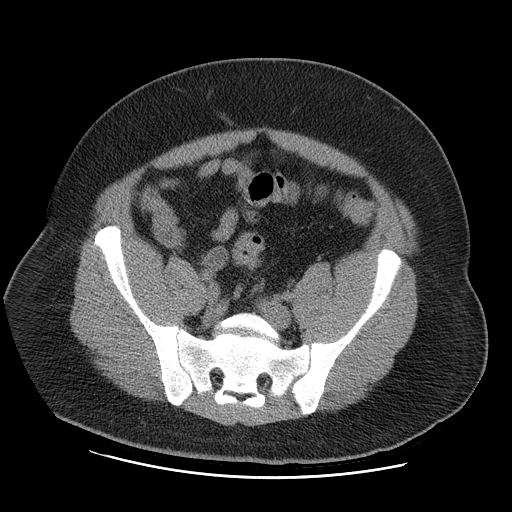
[im 26/77  lung]
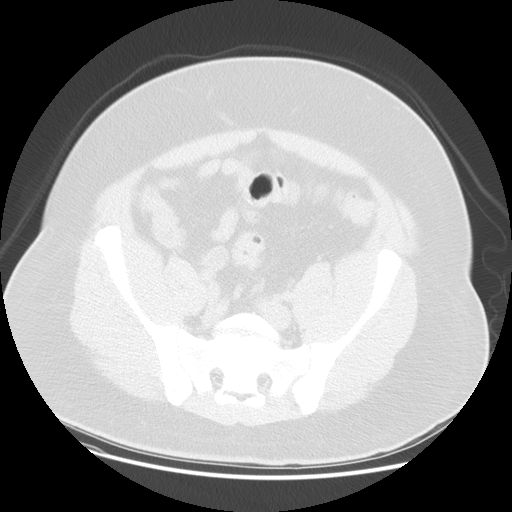
[im 39/77  soft-tissue]
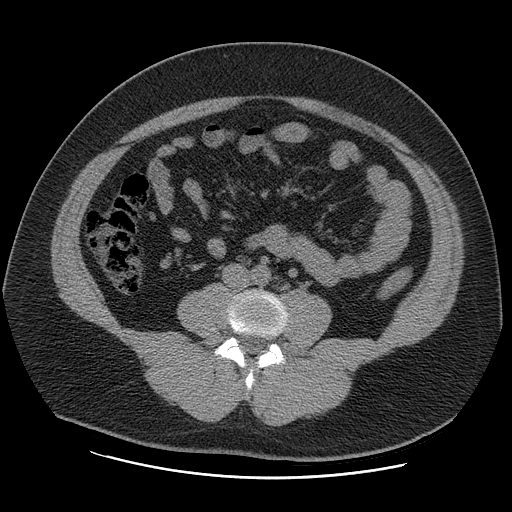
[im 39/77  lung]
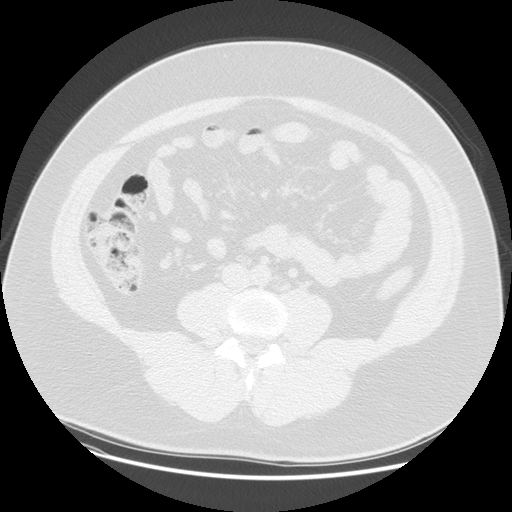
[im 51/77  soft-tissue]
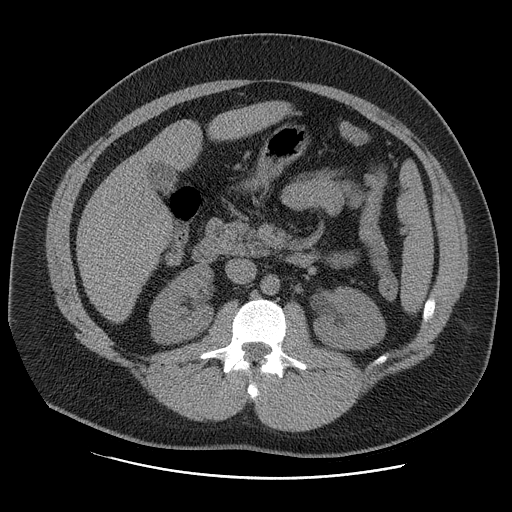
[im 51/77  lung]
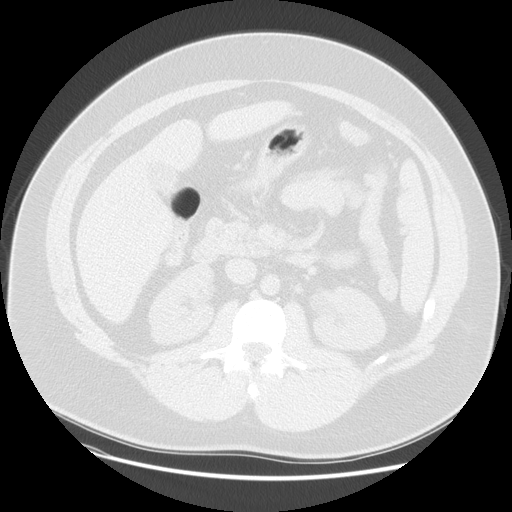
[im 64/77  soft-tissue]
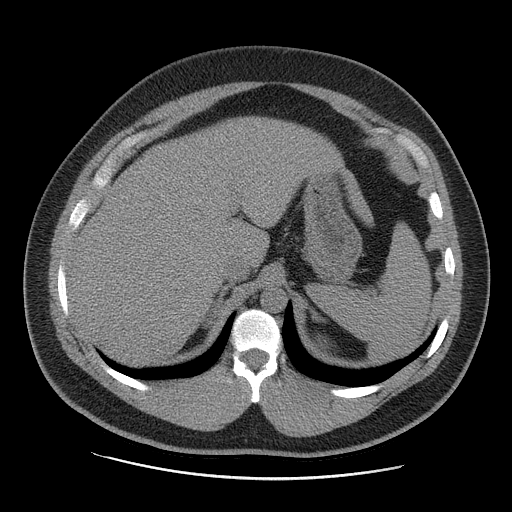
[im 64/77  lung]
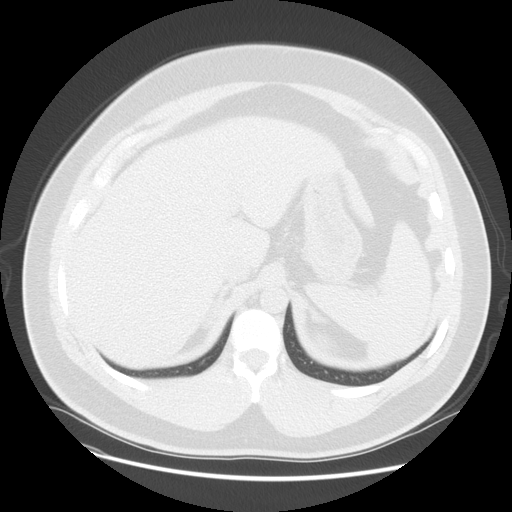

[Series 400: sag · sagittal · 0.80mm/px · 8 of 139 slices shown]
[im 13/139  soft-tissue]
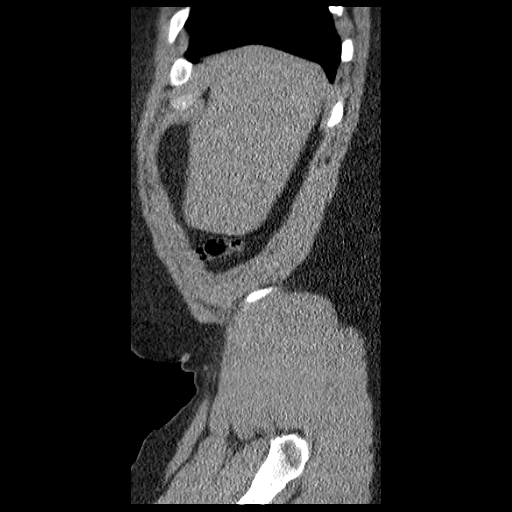
[im 26/139  soft-tissue]
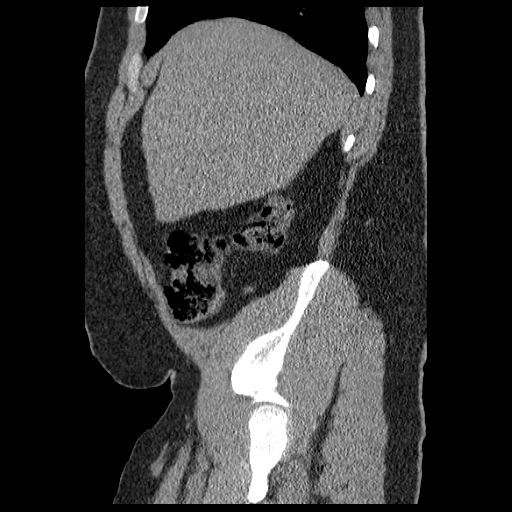
[im 51/139  soft-tissue]
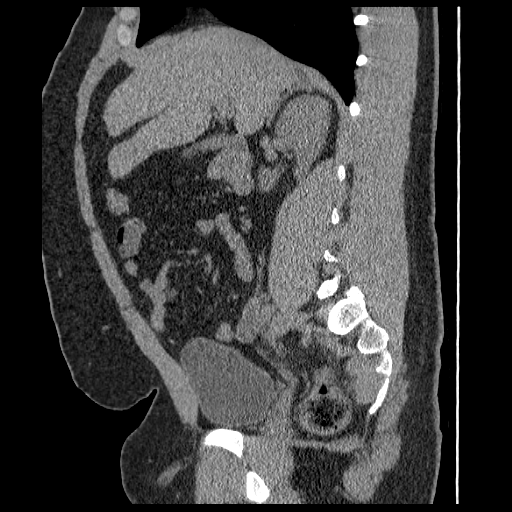
[im 63/139  soft-tissue]
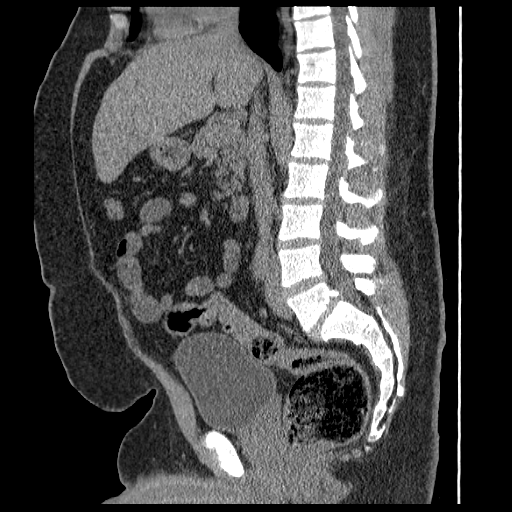
[im 76/139  soft-tissue]
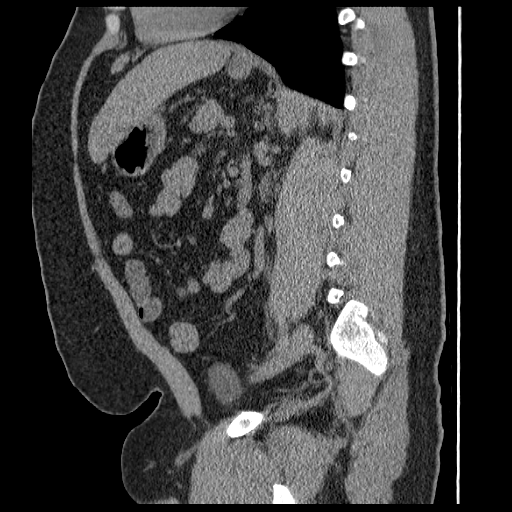
[im 88/139  soft-tissue]
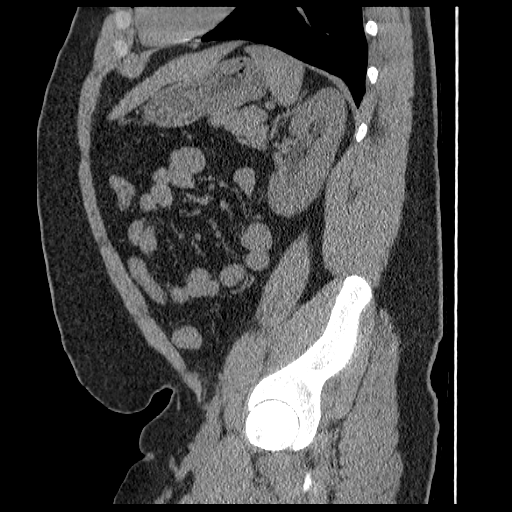
[im 113/139  soft-tissue]
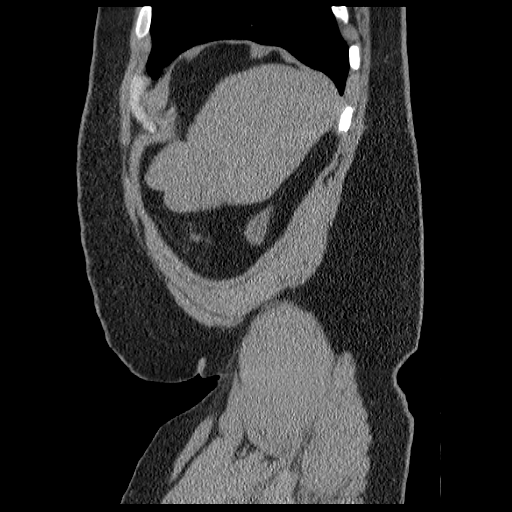
[im 126/139  soft-tissue]
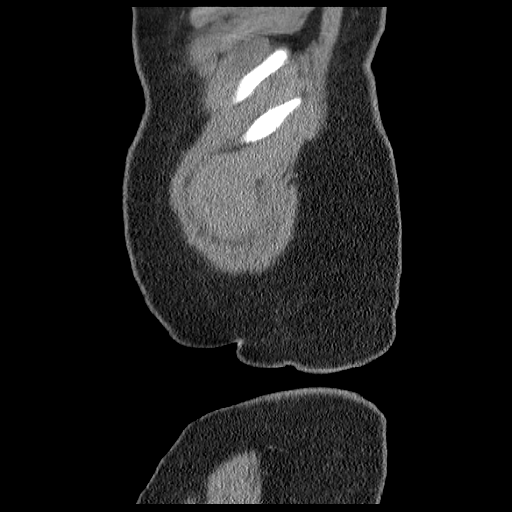

[13 of 32 positions shown; findings below may reference images not displayed]

FINDINGS: There is a 4 mm calculus in the proximal left ureter seen
on image 35.  This is not well seen on the scout image but is
located at the L3 level.  There is mild proximal hydronephrosis and
perinephric soft tissue stranding.  No other definite urinary tract
calculi are demonstrated.

The lung bases are clear.  There is no pleural effusion.  As
evaluated in the noncontrast state, the liver, gallbladder, biliary
system, pancreas, spleen and adrenal glands appear unremarkable.

Small ileocolonic mesenteric lymph nodes are not pathologically
enlarged.  The stomach, small bowel and colon appear normal.  The
appendix appears normal.  The bladder, prostate gland and seminal
vesicles appear normal.

Again demonstrated are bilateral L5 pars defects associated with a
grade 1 anterolisthesis and biforaminal stenosis at L5-S1.
IMPRESSION: 1.  Obstructing 4 mm calculus in the proximal left ureter.
2.  No other acute abdominal pelvic findings.
3.  Unchanged bilateral L5 pars defects with associated grade 1
anterolisthesis and biforaminal stenosis at L5-S1.

## 2013-10-19 IMAGING — CR DG ABDOMEN 1V
2 series · 2 of 2 positions shown · non-contrast
Comparison: CT 08/20/2012

CLINICAL DATA: Left-sided kidney stone with new left-sided pain.

ABDOMEN - 1 VIEW

[t abdomen supine (1 of 2)]
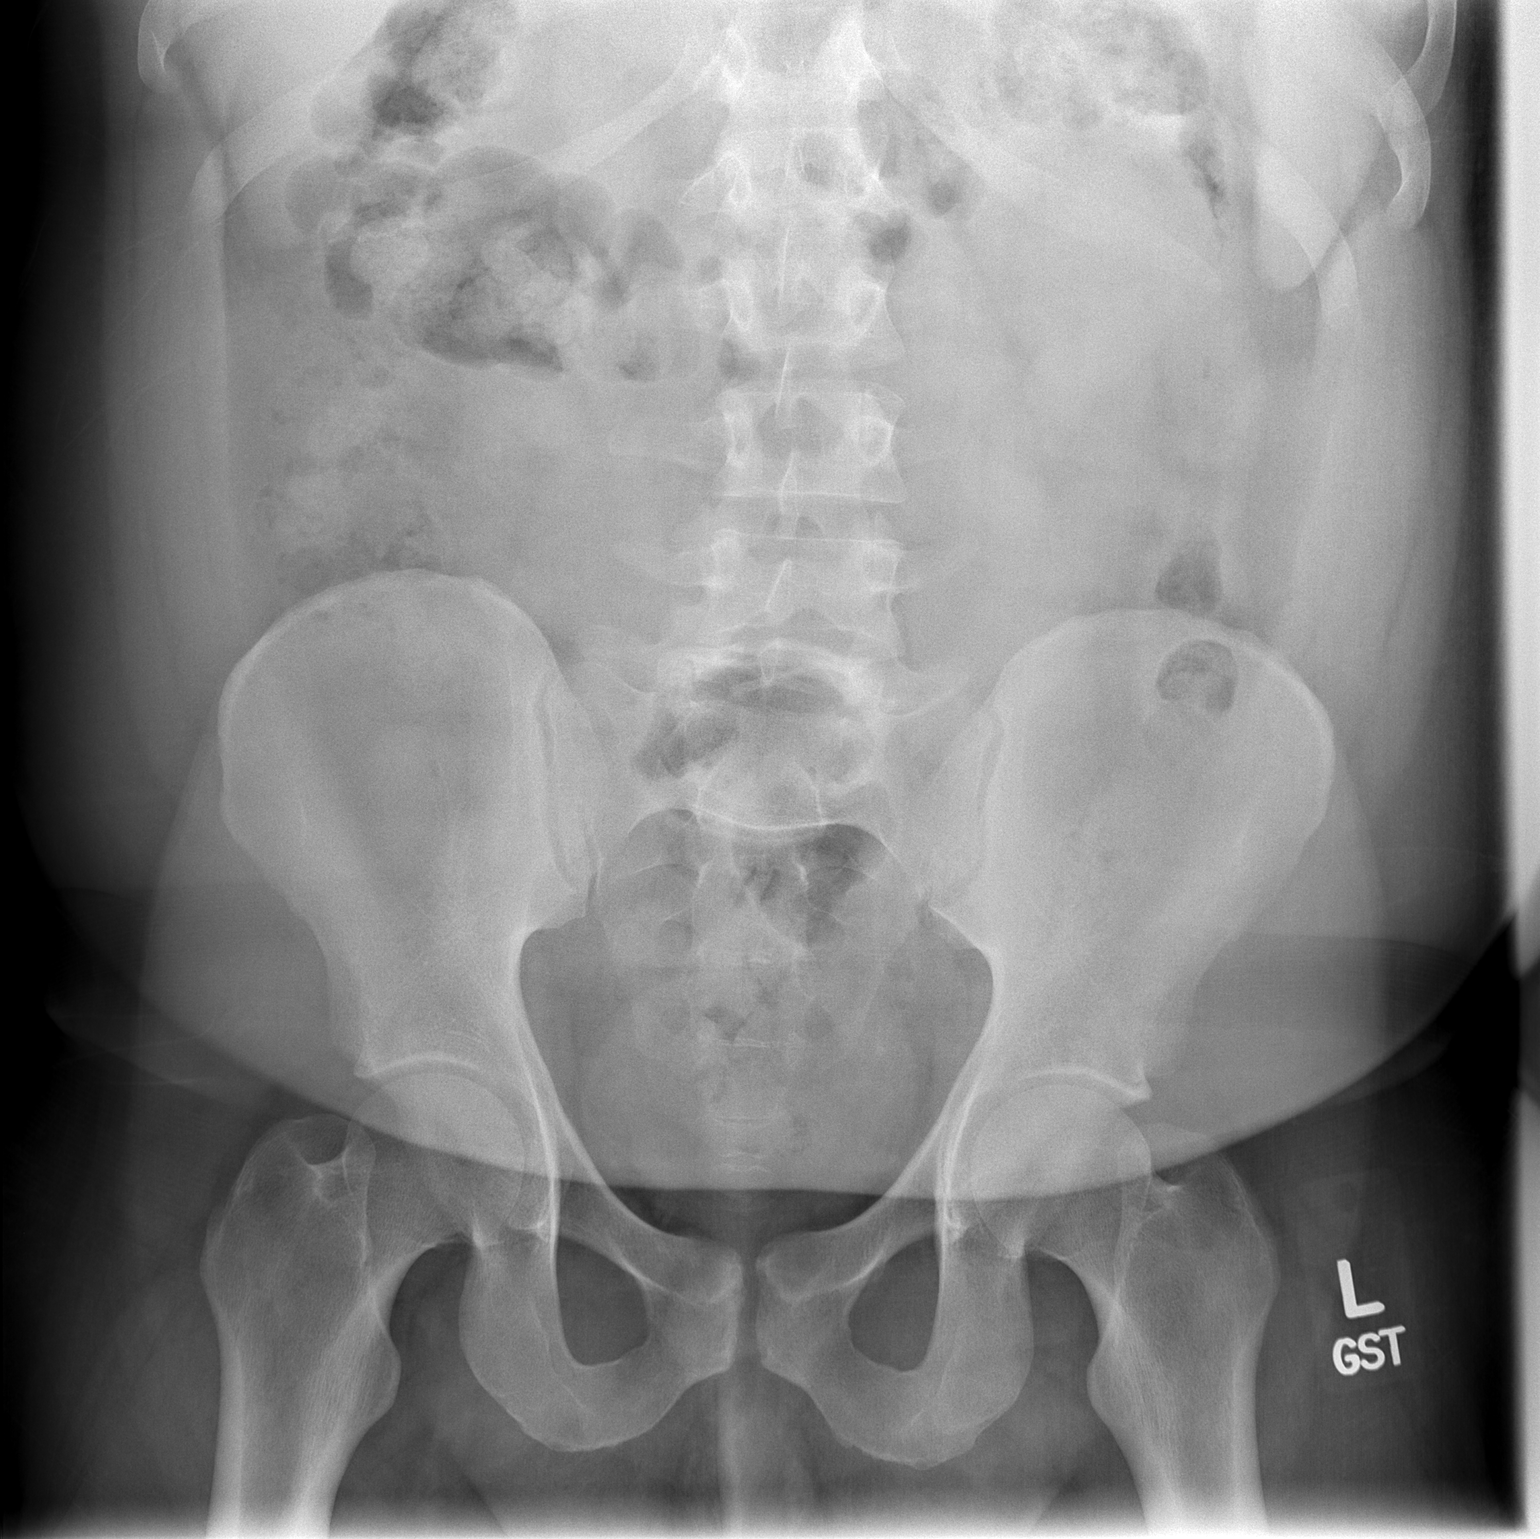

[t abdomen supine (2 of 2)]
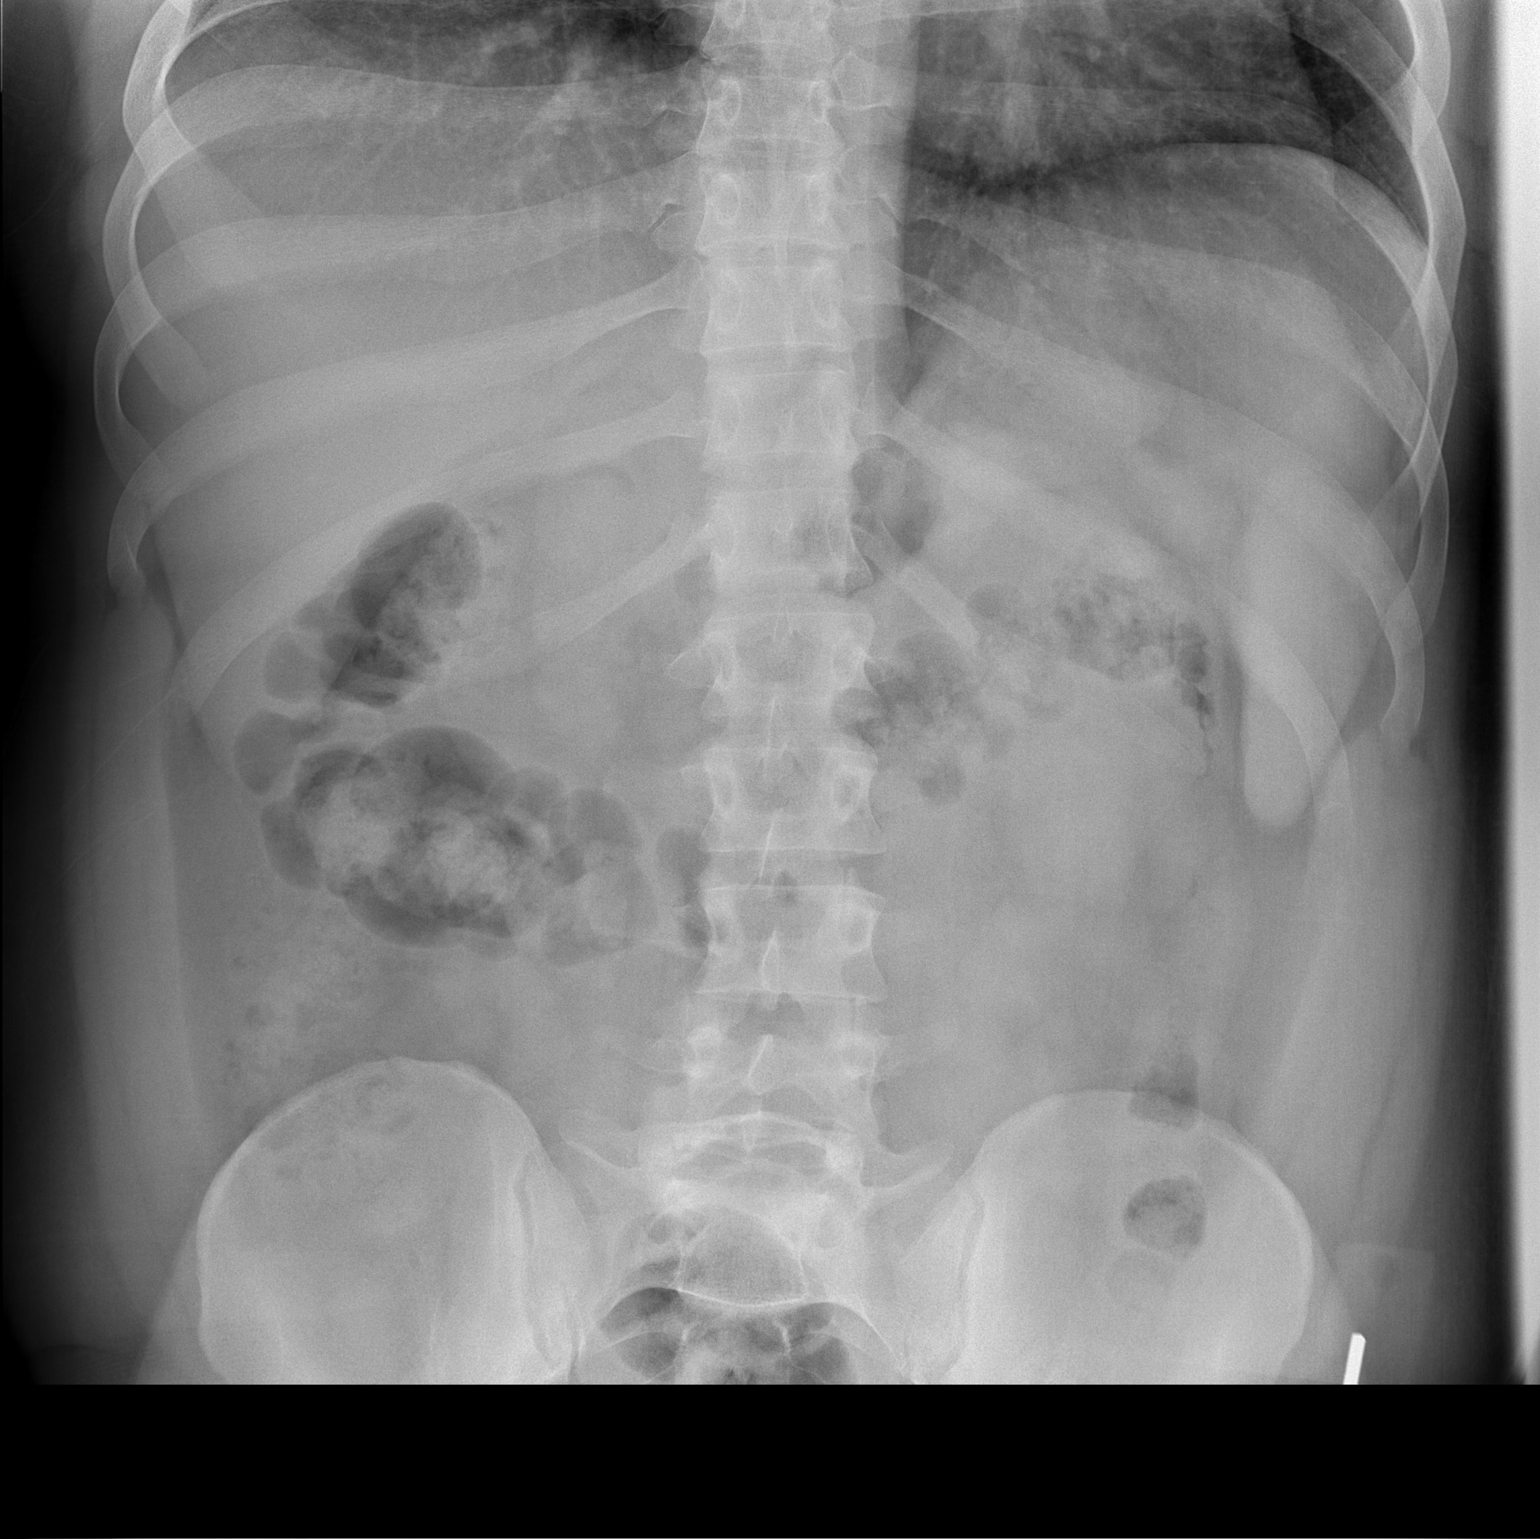

[2 of 2 positions shown; findings below may reference images not displayed]

FINDINGS: Scattered gas and stool in the colon.  No small or large
bowel distension.  No radiopaque stones identified.  However, the
previous left renal stone on CT was not identified on the scout
view and nonvisualization today may not necessarily indicate that
the stone is no longer present.  Visualized bones appear intact.
IMPRESSION: Nonobstructive bowel gas pattern.  No radiopaque stones are
identified.

## 2017-02-10 ENCOUNTER — Ambulatory Visit (INDEPENDENT_AMBULATORY_CARE_PROVIDER_SITE_OTHER): Payer: Self-pay | Admitting: Family Medicine

## 2017-02-10 ENCOUNTER — Encounter: Payer: Self-pay | Admitting: Family Medicine

## 2017-02-10 VITALS — BP 140/90 | HR 122 | Wt 206.0 lb

## 2017-02-10 DIAGNOSIS — M109 Gout, unspecified: Secondary | ICD-10-CM

## 2017-02-10 DIAGNOSIS — W57XXXA Bitten or stung by nonvenomous insect and other nonvenomous arthropods, initial encounter: Secondary | ICD-10-CM

## 2017-02-10 LAB — COMPREHENSIVE METABOLIC PANEL
ALK PHOS: 74 U/L (ref 40–115)
ALT: 22 U/L (ref 9–46)
AST: 16 U/L (ref 10–40)
Albumin: 4.3 g/dL (ref 3.6–5.1)
BILIRUBIN TOTAL: 0.5 mg/dL (ref 0.2–1.2)
BUN: 10 mg/dL (ref 7–25)
CALCIUM: 9.2 mg/dL (ref 8.6–10.3)
CO2: 27 mmol/L (ref 20–31)
CREATININE: 0.99 mg/dL (ref 0.60–1.35)
Chloride: 103 mmol/L (ref 98–110)
GLUCOSE: 83 mg/dL (ref 65–99)
Potassium: 3.4 mmol/L — ABNORMAL LOW (ref 3.5–5.3)
SODIUM: 140 mmol/L (ref 135–146)
Total Protein: 7.2 g/dL (ref 6.1–8.1)

## 2017-02-10 LAB — CBC WITH DIFFERENTIAL/PLATELET
BASOS PCT: 1 %
Basophils Absolute: 142 cells/uL (ref 0–200)
EOS PCT: 3 %
Eosinophils Absolute: 426 cells/uL (ref 15–500)
HCT: 47.5 % (ref 38.5–50.0)
HEMOGLOBIN: 17 g/dL (ref 13.2–17.1)
LYMPHS ABS: 3124 {cells}/uL (ref 850–3900)
Lymphocytes Relative: 22 %
MCH: 29.3 pg (ref 27.0–33.0)
MCHC: 35.8 g/dL (ref 32.0–36.0)
MCV: 81.8 fL (ref 80.0–100.0)
MONO ABS: 852 {cells}/uL (ref 200–950)
MPV: 9.5 fL (ref 7.5–12.5)
Monocytes Relative: 6 %
NEUTROS ABS: 9656 {cells}/uL — AB (ref 1500–7800)
Neutrophils Relative %: 68 %
Platelets: 400 10*3/uL (ref 140–400)
RBC: 5.81 MIL/uL — AB (ref 4.20–5.80)
RDW: 13.4 % (ref 11.0–15.0)
WBC: 14.2 10*3/uL — AB (ref 4.0–10.5)

## 2017-02-10 MED ORDER — ALLOPURINOL 100 MG PO TABS
100.0000 mg | ORAL_TABLET | Freq: Every day | ORAL | 1 refills | Status: DC
Start: 2017-02-10 — End: 2017-09-16

## 2017-02-10 NOTE — Patient Instructions (Signed)
Take 1 allopurinol daily for a couple weeks then go to 2 for a couple weeks then go to 3. Set up an appointment here for about 3 months. Take one Aleve daily to help prevent a gout attack

## 2017-02-10 NOTE — Progress Notes (Signed)
   Subjective:    Patient ID: Colin Sparks, male    DOB: 1990/09/08, 26 y.o.   MRN: [id number]  HPI He has had an insect bite on the left lateral abdominal area that he would like evaluated. He does remember removing what he thought was a tick from that area but describes it is very small and did not have a blood meal. He also has had difficulty over the last several years and especially in the last several months with left great toe pain and swelling and is concerned about gout. His father does indeed have gout.   Review of Systems     Objective:   Physical Exam Alert and in no distress. Exam of the left flank area does show stippled erythematous type area but no target lesion is noted. Exam of the right great toe does show some swelling in the joint but no tenderness erythema or warmth.       Assessment & Plan:  Gout, unspecified cause, unspecified chronicity, unspecified site - Plan: Uric Acid, allopurinol (ZYLOPRIM) 100 MG tablet, CBC with Differential/Platelet, Comprehensive metabolic panel  Insect bite, initial encounter Recommend conservative care for the insect bite as I do not think it is infected nor represent our RMSF or Lyme disease. I discussed treatment of gout. Presently he does not have insurance but blood work and uric acid level will be drawn. He clinically does have what I think is gout. Discussed possibly getting x-rays in the future. He will start on allopurinol 100 mg for 2 weeks then 200 for 2 weeks and then to 300. Cautioned about using Aleve to help prevent a gout attack.

## 2017-02-11 LAB — URIC ACID: Uric Acid, Serum: 9.4 mg/dL — ABNORMAL HIGH (ref 4.0–8.0)

## 2017-09-16 ENCOUNTER — Ambulatory Visit: Payer: Self-pay | Admitting: Medical

## 2017-09-16 ENCOUNTER — Ambulatory Visit (INDEPENDENT_AMBULATORY_CARE_PROVIDER_SITE_OTHER): Payer: Self-pay | Admitting: Medical

## 2017-09-16 ENCOUNTER — Encounter: Payer: Self-pay | Admitting: Medical

## 2017-09-16 VITALS — BP 176/90 | HR 112 | Wt 218.2 lb

## 2017-09-16 DIAGNOSIS — I10 Essential (primary) hypertension: Secondary | ICD-10-CM

## 2017-09-16 DIAGNOSIS — E669 Obesity, unspecified: Secondary | ICD-10-CM

## 2017-09-16 DIAGNOSIS — M109 Gout, unspecified: Secondary | ICD-10-CM

## 2017-09-16 MED ORDER — ALLOPURINOL 100 MG PO TABS: 100.0000 mg | ORAL_TABLET | Freq: Every day | ORAL | 1 refills | Status: DC

## 2017-09-16 MED ORDER — LISINOPRIL-HYDROCHLOROTHIAZIDE 10-12.5 MG PO TABS: 1.0000 | ORAL_TABLET | Freq: Every day | ORAL | 3 refills | Status: DC

## 2017-09-16 NOTE — Progress Notes (Signed)
Subjective: Chief Complaint  Patient presents with  . bp running high, headaches    b/p running high , having headaches, weight gain    Here for weight gain, elevated BPs.  Having headaches regularly now.    Mom is a Engineer, civil (consulting)nurse, and took BP recently and it was "horrendously high."  He has a history of hypertension, obesity.  I last saw him 2014.  He was on blood pressure medication in the past, but at some point quit taking it  He recently for the last few months has been having headaches, elevated blood pressures, not feeling well.  He snores, but no witnessed apnea.  His father does have a history of sleep apnea on CPAP  He was last exercising regularly before Christmas but slacked off.  He has recently begun to make diet changes.   Has hx/o gout, on medication to cut down on gout flares.                                       In recent months headaches and elevated BPs are keeping him from exercise.  Was smoking Hookah for a while, but recently cut this out.   Not drinking alcohol.   Does smoke marijuana some, last in 06/2017.  No cocaine or heroin.   Past Medical History:  Diagnosis Date  . ADD (attention deficit disorder)   . Hypertension   . Kidney stone    No current outpatient medications on file prior to visit.   No current facility-administered medications on file prior to visit.    ROS as in subjective     Objective: BP (!) 176/90   Pulse (!) 112   Wt 218 lb 3.2 oz (99 kg)   SpO2 99%   BMI 35.22 kg/m   BP Readings from Last 3 Encounters:  09/16/17 (!) 176/90  02/10/17 140/90  06/27/13 130/80   Wt Readings from Last 3 Encounters:  09/16/17 218 lb 3.2 oz (99 kg)  02/10/17 206 lb (93.4 kg)  06/27/13 190 lb (86.2 kg)   General appearance: alert, no distress, WD/WN, obese white male Neck: supple, no lymphadenopathy, no thyromegaly, no masses, no JVD Heart:mildly tachycardic, otherwise RRR, normal S1, S2, no murmurs Lungs: CTA bilaterally, no wheezes, rhonchi, or  rales Extremities: no edema, no cyanosis, no clubbing Pulses: 2+ symmetric, upper and lower extremities, normal cap refill Neurological: alert, oriented x 3, CN2-12 intact, strength normal upper extremities and lower extremities, sensation normal throughout, DTRs 2+ throughout, no cerebellar signs, gait normal Psychiatric: normal affect, behavior normal, pleasant     Assessment: Encounter Diagnoses  Name Primary?  . Hypertension Yes  . Gout, unspecified cause, unspecified chronicity, unspecified site   . Obesity with serious comorbidity, unspecified classification, unspecified obesity type      Plan: HTN - discussed concerns, prior BPs, prior medications.  We discussed his risk for complication, and discussed the need to make a significant change in his diet and exercise.   Restart medication as below.    counseled on diet, exercise   Gout - c/t allopurinol, improved   F/u 30mo   Colin Sparks was seen today for bp running high, headaches.  Diagnoses and all orders for this visit:  Hypertension -     lisinopril-hydrochlorothiazide (PRINZIDE,ZESTORETIC) 10-12.5 MG tablet; Take 1 tablet by mouth daily.  Gout, unspecified cause, unspecified chronicity, unspecified site -     allopurinol (ZYLOPRIM) 100  MG tablet; Take 1 tablet (100 mg total) by mouth daily.  Obesity with serious comorbidity, unspecified classification, unspecified obesity type

## 2018-01-25 ENCOUNTER — Telehealth: Payer: Self-pay | Admitting: Medical

## 2018-01-25 NOTE — Telephone Encounter (Signed)
Needs refill on bp meds only has a few left  Lisinopril/hctz  [facility] [address]

## 2018-01-26 NOTE — Telephone Encounter (Signed)
Needs appt

## 2018-01-26 NOTE — Telephone Encounter (Signed)
Left message on home # to call to schedule appointment, called cell # but mailbox full

## 2018-01-27 ENCOUNTER — Other Ambulatory Visit: Payer: Self-pay | Admitting: Medical

## 2018-01-27 DIAGNOSIS — I10 Essential (primary) hypertension: Secondary | ICD-10-CM

## 2018-01-28 NOTE — Telephone Encounter (Signed)
I saw that this was sent to you on 6/3 and you wanted an appt scheduled. I called and left message on home number as Melissa did on 6/3 but cell number mailbox is still full. Please let me know how you would like this handled. Deny? Give #[phone]?

## 2018-01-28 NOTE — Telephone Encounter (Signed)
pts mom called back and made Nester a medcheck appt for June 10 if we could give him a 30 day supply pt uses [id number] - [place], [place] - [address]

## 2018-02-01 ENCOUNTER — Ambulatory Visit (INDEPENDENT_AMBULATORY_CARE_PROVIDER_SITE_OTHER): Payer: Self-pay | Admitting: Medical

## 2018-02-01 ENCOUNTER — Encounter: Payer: Self-pay | Admitting: Medical

## 2018-02-01 VITALS — BP 166/100 | HR 78 | Temp 98.0°F | Ht 65.75 in | Wt 223.4 lb

## 2018-02-01 DIAGNOSIS — M1000 Idiopathic gout, unspecified site: Secondary | ICD-10-CM

## 2018-02-01 DIAGNOSIS — I10 Essential (primary) hypertension: Secondary | ICD-10-CM

## 2018-02-01 DIAGNOSIS — E669 Obesity, unspecified: Secondary | ICD-10-CM

## 2018-02-01 MED ORDER — METOPROLOL TARTRATE 50 MG PO TABS: 50.0000 mg | ORAL_TABLET | Freq: Two times a day (BID) | ORAL | 0 refills | Status: DC

## 2018-02-01 MED ORDER — LISINOPRIL 20 MG PO TABS: 20.0000 mg | ORAL_TABLET | Freq: Every day | ORAL | 0 refills | Status: DC

## 2018-02-01 NOTE — Progress Notes (Signed)
   Subjective:   Colin Sparks is a 27 y.o. male presenting on 02/01/2018 with Medication Management  Here for med check.  He is compliant with his medications lisinopril HCT 10/12.5 mg daily and allopurinol 100 mg daily.  He checks his blood pressures.  Pressure stay elevated.  He notes no real change in activity or exercise.   He tends to stress eat.   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  He has not been the working in about 7 months.  Had to move back in with his parents.  No other aggravating or relieving factors.  No other complaint.  Review of Systems ROS as in subjective     Objective: BP (!) 166/100   Pulse 78   Temp 98 F (36.7 C) (Oral)   Ht 5' 5.75" (1.67 m)   Wt 223 lb 6.4 oz (101.3 kg)   SpO2 99%   BMI 36.33 kg/m   General appearance: alert, no distress, WD/WN Neck: supple, no lymphadenopathy, no thyromegaly, no masses Heart: RRR, normal S1, S2, no murmurs Lungs: CTA bilaterally, no wheezes, rhonchi, or rales Pulses: 2+ symmetric, upper and lower extremities, normal cap refill      Assessment: Encounter Diagnoses  Name Primary?  . Essential hypertension, benign Yes  . Idiopathic gout, unspecified chronicity, unspecified site   . Obesity with serious comorbidity, unspecified classification, unspecified obesity type      Plan: Counseled on diet, exercise, medications, compliance, need to change lifestyle.  Medication changes as below. labs today.  Colin Sparks was seen today for medication management.  Diagnoses and all orders for this visit:  Essential hypertension, benign -     Basic metabolic panel -     Uric acid  Idiopathic gout, unspecified chronicity, unspecified site -     Basic metabolic panel -     Uric acid  Obesity with serious comorbidity, unspecified classification, unspecified obesity type -     Basic metabolic panel -     Uric acid  Other orders -     lisinopril  (PRINIVIL,ZESTRIL) 20 MG tablet; Take 1 tablet (20 mg total) by mouth daily. -     metoprolol tartrate (LOPRESSOR) 50 MG tablet; Take 1 tablet (50 mg total) by mouth 2 (two) times daily.

## 2018-02-01 NOTE — Patient Instructions (Signed)
Recommendations:  We will call with lab results  STOP lisinopril HCT  BEGIN Lisinopril 20mg  daily in the morning  BEGIN Metoprolol 50mg , start 1/2 tablet breakfast and dinner  After 2 weeks increase to 50mg , 1 tablet breakfast and dinner  Try to get 30-60 minutes of exercise daily such as hiking, biking, walking, or group sport  Work on diet changes  Check blood pressures regularly, such as 3-4 days per week  Goal is 120/70  If your pressure is not <140/90 in 30 days, we need to make another adjustment  Monitor blood pressures and call, My Chart message or recheck in 1 month    DASH Eating Plan DASH stands for "Dietary Approaches to Stop Hypertension." The DASH eating plan is a healthy eating plan that has been shown to reduce high blood pressure (hypertension). Additional health benefits may include reducing the risk of type 2 diabetes mellitus, heart disease, and stroke. The DASH eating plan may also help with weight loss. WHAT DO I NEED TO KNOW ABOUT THE DASH EATING PLAN? For the DASH eating plan, you will follow these general guidelines:  Choose foods with a percent daily value for sodium of less than 5% (as listed on the food label).  Use salt-free seasonings or herbs instead of table salt or sea salt.  Check with your health care provider or pharmacist before using salt substitutes.  Eat lower-sodium products, often labeled as "lower sodium" or "no salt added."  Eat fresh foods.  Eat more vegetables, fruits, and low-fat dairy products.  Choose whole grains. Look for the word "whole" as the first word in the ingredient list.  Choose fish and skinless chicken or [place] more often than red meat. Limit fish, poultry, and meat to 6 oz (170 g) each day.  Limit sweets, desserts, sugars, and sugary drinks.  Choose heart-healthy fats.  Limit cheese to 1 oz (28 g) per day.  Eat more home-cooked food and less restaurant, buffet, and fast food.  Limit fried  foods.  Cook foods using methods other than frying.  Limit canned vegetables. If you do use them, rinse them well to decrease the sodium.  When eating at a restaurant, ask that your food be prepared with less salt, or no salt if possible. WHAT FOODS CAN I EAT? Seek help from a dietitian for individual calorie needs. Grains Whole grain or whole wheat bread. Brown rice. Whole grain or whole wheat pasta. Quinoa, bulgur, and whole grain cereals. Low-sodium cereals. Corn or whole wheat flour tortillas. Whole grain cornbread. Whole grain crackers. Low-sodium crackers. Vegetables Fresh or frozen vegetables (raw, steamed, roasted, or grilled). Low-sodium or reduced-sodium tomato and vegetable juices. Low-sodium or reduced-sodium tomato sauce and paste. Low-sodium or reduced-sodium canned vegetables.  Fruits All fresh, canned (in natural juice), or frozen fruits. Meat and Other Protein Products Ground beef (85% or leaner), grass-fed beef, or beef trimmed of fat. Skinless chicken or [place]. Ground chicken or [place]. Pork trimmed of fat. All fish and seafood. Eggs. Dried beans, peas, or lentils. Unsalted nuts and seeds. Unsalted canned beans. Dairy Low-fat dairy products, such as skim or 1% milk, 2% or reduced-fat cheeses, low-fat ricotta or cottage cheese, or plain low-fat yogurt. Low-sodium or reduced-sodium cheeses. Fats and Oils Tub margarines without trans fats. Light or reduced-fat mayonnaise and salad dressings (reduced sodium). Avocado. Safflower, olive, or canola oils. Natural peanut or almond butter. Other Unsalted popcorn and pretzels. The items listed above may not be a complete list of recommended foods or beverages.  Contact your dietitian for more options. WHAT FOODS ARE NOT RECOMMENDED? Grains White bread. White pasta. White rice. Refined cornbread. Bagels and croissants. Crackers that contain trans fat. Vegetables Creamed or fried vegetables. Vegetables in a cheese sauce. Regular  canned vegetables. Regular canned tomato sauce and paste. Regular tomato and vegetable juices. Fruits Dried fruits. Canned fruit in light or heavy syrup. Fruit juice. Meat and Other Protein Products Fatty cuts of meat. Ribs, chicken wings, bacon, sausage, bologna, salami, chitterlings, fatback, hot dogs, bratwurst, and packaged luncheon meats. Salted nuts and seeds. Canned beans with salt. Dairy Whole or 2% milk, cream, half-and-half, and cream cheese. Whole-fat or sweetened yogurt. Full-fat cheeses or blue cheese. Nondairy creamers and whipped toppings. Processed cheese, cheese spreads, or cheese curds. Condiments Onion and garlic salt, seasoned salt, table salt, and sea salt. Canned and packaged gravies. Worcestershire sauce. Tartar sauce. Barbecue sauce. Teriyaki sauce. Soy sauce, including reduced sodium. Steak sauce. Fish sauce. Oyster sauce. Cocktail sauce. Horseradish. Ketchup and mustard. Meat flavorings and tenderizers. Bouillon cubes. Hot sauce. Tabasco sauce. Marinades. Taco seasonings. Relishes. Fats and Oils Butter, stick margarine, lard, shortening, ghee, and bacon fat. Coconut, palm kernel, or palm oils. Regular salad dressings. Other Pickles and olives. Salted popcorn and pretzels. The items listed above may not be a complete list of foods and beverages to avoid. Contact your dietitian for more information. WHERE CAN I FIND MORE INFORMATION? National Heart, Lung, and Blood Institute: [employer] Document Released: 07/31/2011 Document Revised: 12/26/2013 Document Reviewed: 06/15/2013 [facility] Patient Information 2015 [place], [place]. This information is not intended to replace advice given to you by your health care provider. Make sure you discuss any questions you have with your health care provider.        Why follow it? Research shows. . Those who follow the Mediterranean diet have a reduced risk of heart disease  . The diet is  associated with a reduced incidence of Parkinson's and Alzheimer's diseases . People following the diet may have longer life expectancies and lower rates of chronic diseases  . The Dietary Guidelines for Americans recommends the Mediterranean diet as an eating plan to promote health and prevent disease  What Is the Mediterranean Diet?  . Healthy eating plan based on typical foods and recipes of Mediterranean-style cooking . The diet is primarily a plant based diet; these foods should make up a majority of meals   Starches - Plant based foods should make up a majority of meals - They are an important sources of vitamins, minerals, energy, antioxidants, and fiber - Choose whole grains, foods high in fiber and minimally processed items  - Typical grain sources include wheat, oats, barley, corn, brown rice, bulgar, farro, millet, polenta, couscous  - Various types of beans include chickpeas, lentils, fava beans, black beans, white beans   Fruits  Veggies - Large quantities of antioxidant rich fruits & veggies; 6 or more servings  - Vegetables can be eaten raw or lightly drizzled with oil and cooked  - Vegetables common to the traditional Mediterranean Diet include: artichokes, arugula, beets, broccoli, brussel sprouts, cabbage, carrots, celery, collard greens, cucumbers, eggplant, kale, leeks, lemons, lettuce, mushrooms, okra, onions, peas, peppers, potatoes, pumpkin, radishes, rutabaga, shallots, spinach, sweet potatoes, turnips, zucchini - Fruits common to the Mediterranean Diet include: apples, apricots, avocados, cherries, clementines, dates, figs, grapefruits, grapes, melons, nectarines, oranges, peaches, pears, pomegranates, strawberries, tangerines  Fats - Replace butter and margarine with healthy oils, such as olive oil, canola oil, and tahini  - Limit  nuts to no more than a handful a day  - Nuts include walnuts, almonds, pecans, pistachios, pine nuts  - Limit or avoid candied, honey roasted  or heavily salted nuts - Olives are central to the Mediterranean diet - can be eaten whole or used in a variety of dishes   Meats Protein - Limiting red meat: no more than a few times a month - When eating red meat: choose lean cuts and keep the portion to the size of deck of cards - Eggs: approx. 0 to 4 times a week  - Fish and lean poultry: at least 2 a week  - Healthy protein sources include, chicken, [place], lean beef, lamb - Increase intake of seafood such as tuna, salmon, trout, mackerel, shrimp, scallops - Avoid or limit high fat processed meats such as sausage and bacon  Dairy - Include moderate amounts of low fat dairy products  - Focus on healthy dairy such as fat free yogurt, skim milk, low or reduced fat cheese - Limit dairy products higher in fat such as whole or 2% milk, cheese, ice cream  Alcohol - Moderate amounts of red wine is ok  - No more than 5 oz daily for women (all ages) and men older than age 42  - No more than 10 oz of wine daily for men younger than 54  Other - Limit sweets and other desserts  - Use herbs and spices instead of salt to flavor foods  - Herbs and spices common to the traditional Mediterranean Diet include: basil, bay leaves, chives, cloves, cumin, fennel, garlic, lavender, marjoram, mint, oregano, parsley, pepper, rosemary, sage, savory, sumac, tarragon, thyme   It's not just a diet, it's a lifestyle:  . The Mediterranean diet includes lifestyle factors typical of those in the region  . Foods, drinks and meals are best eaten with others and savored . Daily physical activity is important for overall good health . This could be strenuous exercise like running and aerobics . This could also be more leisurely activities such as walking, housework, yard-work, or taking the stairs . Moderation is the key; a balanced and healthy diet accommodates most foods and drinks . Consider portion sizes and frequency of consumption of certain foods   Meal Ideas &  Options:  . Breakfast:  o Whole wheat toast or whole wheat English muffins with peanut butter & hard boiled egg o Steel cut oats topped with apples & cinnamon and skim milk  o Fresh fruit: banana, strawberries, melon, berries, peaches  o Smoothies: strawberries, bananas, greek yogurt, peanut butter o Low fat greek yogurt with blueberries and granola  o Egg white omelet with spinach and mushrooms o Breakfast couscous: whole wheat couscous, apricots, skim milk, cranberries  . Sandwiches:  o Hummus and grilled vegetables (peppers, zucchini, squash) on whole wheat bread   o Grilled chicken on whole wheat pita with lettuce, tomatoes, cucumbers or tzatziki  o Tuna salad on whole wheat bread: tuna salad made with greek yogurt, olives, red peppers, capers, green onions o Garlic rosemary lamb pita: lamb sauted with garlic, rosemary, salt & pepper; add lettuce, cucumber, greek yogurt to pita - flavor with lemon juice and black pepper  . Seafood:  o Mediterranean grilled salmon, seasoned with garlic, basil, parsley, lemon juice and black pepper o Shrimp, lemon, and spinach whole-grain pasta salad made with low fat greek yogurt  o Seared scallops with lemon orzo  o Seared tuna steaks seasoned salt, pepper, coriander topped with tomato mixture  of olives, tomatoes, olive oil, minced garlic, parsley, green onions and cappers  . Meats:  o Herbed greek chicken salad with kalamata olives, cucumber, feta  o Red bell peppers stuffed with spinach, bulgur, lean ground beef (or lentils) & topped with feta   o Kebabs: skewers of chicken, tomatoes, onions, zucchini, squash  o [place] burgers: made with red onions, mint, dill, lemon juice, feta cheese topped with roasted red peppers . Vegetarian o Cucumber salad: cucumbers, artichoke hearts, celery, red onion, feta cheese, tossed in olive oil & lemon juice  o Hummus and whole grain pita points with a greek salad (lettuce, tomato, feta, olives, cucumbers, red  onion) o Lentil soup with celery, carrots made with vegetable broth, garlic, salt and pepper  o Tabouli salad: parsley, bulgur, mint, scallions, cucumbers, tomato, radishes, lemon juice, olive oil, salt and pepper.

## 2018-02-02 ENCOUNTER — Other Ambulatory Visit: Payer: Self-pay | Admitting: Medical

## 2018-02-02 LAB — BASIC METABOLIC PANEL
BUN/Creatinine Ratio: 8 — ABNORMAL LOW (ref 9–20)
BUN: 9 mg/dL (ref 6–20)
CO2: 21 mmol/L (ref 20–29)
Calcium: 9.8 mg/dL (ref 8.7–10.2)
Chloride: 100 mmol/L (ref 96–106)
Creatinine, Ser: 1.09 mg/dL (ref 0.76–1.27)
GFR, EST AFRICAN AMERICAN: 105 mL/min/{1.73_m2} (ref >59)
GFR, EST NON AFRICAN AMERICAN: 91 mL/min/{1.73_m2} (ref >59)
Glucose: 87 mg/dL (ref 65–99)
POTASSIUM: 3.8 mmol/L (ref 3.5–5.2)
Sodium: 137 mmol/L (ref 134–144)

## 2018-02-02 LAB — URIC ACID: Uric Acid: 9.2 mg/dL — ABNORMAL HIGH (ref 3.7–8.6)

## 2018-02-02 MED ORDER — ALLOPURINOL 300 MG PO TABS: 300.0000 mg | ORAL_TABLET | Freq: Every day | ORAL | 3 refills | Status: DC

## 2018-04-30 ENCOUNTER — Other Ambulatory Visit: Payer: Self-pay | Admitting: Medical

## 2018-09-23 ENCOUNTER — Other Ambulatory Visit: Payer: Self-pay | Admitting: Medical

## 2018-09-24 NOTE — Telephone Encounter (Signed)
Left message on voicemail for patient to call back to schedule fasting CPE. 

## 2018-09-24 NOTE — Telephone Encounter (Signed)
Is this ok to refill?  

## 2018-09-24 NOTE — Telephone Encounter (Signed)
Refill and get in for complete physical.   Remind him that its probably cheaper to do labs/screening on a physical.

## 2018-12-15 ENCOUNTER — Other Ambulatory Visit: Payer: Self-pay | Admitting: Medical

## 2018-12-15 NOTE — Telephone Encounter (Signed)
Left message on voicemail for patient to call back to schedule CPE  

## 2019-01-16 ENCOUNTER — Other Ambulatory Visit: Payer: Self-pay | Admitting: Medical

## 2019-01-18 NOTE — Telephone Encounter (Signed)
Left message on voicemail for patient to call and schedule appointment for CPE.

## 2019-01-25 ENCOUNTER — Telehealth: Payer: Self-pay | Admitting: Family Medicine

## 2019-01-25 NOTE — Telephone Encounter (Signed)
Is this ok to refill?  See Lafonda Mosses note

## 2019-01-25 NOTE — Telephone Encounter (Signed)
Called pt home lmtrc, call pt cell mail box full.  Sent email to pt advising CPE needed with fasting labs. Please send 30 day refill of Lisinopril so we can get appt set up.

## 2019-01-26 ENCOUNTER — Other Ambulatory Visit: Payer: Self-pay | Admitting: Medical

## 2019-01-26 MED ORDER — LISINOPRIL 20 MG PO TABS: 20.0000 mg | ORAL_TABLET | Freq: Every day | ORAL | 0 refills | Status: DC

## 2019-01-26 NOTE — Telephone Encounter (Signed)
Tried to call patient and voicemail was full I will send him a letter to call and schedule CPE

## 2019-01-26 NOTE — Telephone Encounter (Signed)
Med sent for 30 days, get in for CPX

## 2019-03-08 ENCOUNTER — Other Ambulatory Visit: Payer: Self-pay | Admitting: Medical

## 2019-03-08 ENCOUNTER — Telehealth: Payer: Self-pay

## 2019-03-08 NOTE — Telephone Encounter (Signed)
lvm for pt to call and schedule a med check appt. Liberal

## 2019-03-08 NOTE — Telephone Encounter (Signed)
Pt was sent a letter about scheduling an appointment for additional refills. Pt has not called to schedule appointment. I was unable to reach patient by phone about scheduling. Is this refill appropriate?

## 2019-03-16 ENCOUNTER — Telehealth: Payer: Self-pay | Admitting: Family Medicine

## 2019-03-16 ENCOUNTER — Other Ambulatory Visit: Payer: Self-pay | Admitting: Medical

## 2019-03-16 MED ORDER — ALLOPURINOL 300 MG PO TABS: 300.0000 mg | ORAL_TABLET | Freq: Every day | ORAL | 0 refills | Status: DC

## 2019-03-16 MED ORDER — METOPROLOL TARTRATE 50 MG PO TABS: 50.0000 mg | ORAL_TABLET | Freq: Two times a day (BID) | ORAL | 0 refills | Status: DC

## 2019-03-16 MED ORDER — LISINOPRIL 20 MG PO TABS: 20.0000 mg | ORAL_TABLET | Freq: Every day | ORAL | 0 refills | Status: DC

## 2019-03-16 NOTE — Telephone Encounter (Signed)
Pt made appt for Monday 07/27 pt needs enough medicine of his lisinopril to last him pt needs [id number] [place], [place]

## 2019-03-21 ENCOUNTER — Other Ambulatory Visit: Payer: Self-pay

## 2019-03-21 ENCOUNTER — Ambulatory Visit (INDEPENDENT_AMBULATORY_CARE_PROVIDER_SITE_OTHER): Payer: Self-pay | Admitting: Medical

## 2019-03-21 ENCOUNTER — Encounter: Payer: Self-pay | Admitting: Medical

## 2019-03-21 VITALS — BP 130/80 | HR 85 | Temp 98.2°F | Resp 16 | Ht 66.0 in | Wt 192.2 lb

## 2019-03-21 DIAGNOSIS — L409 Psoriasis, unspecified: Secondary | ICD-10-CM

## 2019-03-21 DIAGNOSIS — E669 Obesity, unspecified: Secondary | ICD-10-CM

## 2019-03-21 DIAGNOSIS — I10 Essential (primary) hypertension: Secondary | ICD-10-CM

## 2019-03-21 DIAGNOSIS — M109 Gout, unspecified: Secondary | ICD-10-CM

## 2019-03-21 DIAGNOSIS — E790 Hyperuricemia without signs of inflammatory arthritis and tophaceous disease: Secondary | ICD-10-CM | POA: Insufficient documentation

## 2019-03-21 DIAGNOSIS — R21 Rash and other nonspecific skin eruption: Secondary | ICD-10-CM | POA: Insufficient documentation

## 2019-03-21 MED ORDER — LISINOPRIL 20 MG PO TABS: 20.0000 mg | ORAL_TABLET | Freq: Every day | ORAL | 0 refills | Status: DC

## 2019-03-21 MED ORDER — ALLOPURINOL 300 MG PO TABS: 300.0000 mg | ORAL_TABLET | Freq: Every day | ORAL | 0 refills | Status: DC

## 2019-03-21 MED ORDER — TRIAMCINOLONE ACETONIDE 0.1 % EX CREA: 1.0000 "application " | TOPICAL_CREAM | Freq: Two times a day (BID) | CUTANEOUS | 0 refills | Status: AC

## 2019-03-21 NOTE — Progress Notes (Signed)
Subjective:  Colin Sparks is a 28 y.o. male who presents for Chief Complaint  Patient presents with  . follow up    follow up HTN     Here for hypertension follow-up.  Last visit June 2019.  He notes making some major improvement since last visit.  He has changed to a low-carb diet, has lost weight, is exercising regularly.  He is exercising several days a week with either running or walking his dog or going on a hike.  He had lost down to 182 pounds with a ketogenic diet but he started eating pizza lately and gained 10 pounds back.  He is working as a Freight forwarder at [employer] which does not help the diet.     He is compliant with lisinopril 20 mg a for blood pressure without complaint.  He is compliant with allopurinol 300 mg daily for uric acid and gout.  He denies chest pain, difficulty breathing, shortness of breath, edema.  He is checking home blood pressures and generally they are running normal with an occasional elevated reading.  He currently does not have insurance but thinks he will be getting insurance in the near future.  Last year he was drinking alcohol more regularly, but now he drinks alcohol intermittent, occasional.   This was flaring up his gout quite regularly in the past  He has a history of psoriasis.  In the last couple weeks he has been having a rash on his forearms that he thinks is related to sun exposure.  His sister has recently developed a gluten allergy so he also wonders about gluten rash  No other aggravating or relieving factors.    No other c/o.  The following portions of the patient's history were reviewed and updated as appropriate: allergies, current medications, past family history, past medical history, past social history, past surgical history and problem list.  ROS Otherwise as in subjective above    Objective: BP 130/80   Pulse 85   Temp 98.2 F (36.8 C) (Oral)   Resp 16   Ht 5\' 6"  (1.676 m)   Wt 192 lb 3.2 oz (87.2 kg)   SpO2  98%   BMI 31.02 kg/m   General appearance: alert, no distress, well developed, well nourished Bilateral forearms volar surface distally with scattered pink small 2 mm papules in a patch, right elbow posteriorly with scaly rough rash and similarly right anterior knee with scaly rash consistent with psoriasis Neck: supple, no lymphadenopathy, no thyromegaly, no masses, no bruits Heart: RRR, normal S1, S2, no murmurs Lungs: CTA bilaterally, no wheezes, rhonchi, or rales Abdomen: +bs, soft, non tender, non distended, no masses, no hepatomegaly, no splenomegaly, no bruits Pulses: 2+ radial pulses, 2+ pedal pulses, normal cap refill Ext: no edema    Assessment: Encounter Diagnoses  Name Primary?  . Essential hypertension, benign Yes  . Gout, unspecified cause, unspecified chronicity, unspecified site   . Obesity with serious comorbidity, unspecified classification, unspecified obesity type   . Elevated uric acid in blood   . Psoriasis   . Rash      Plan: Hypertension- continue lisinopril 20 mg daily, much improved from last year.  He is supposed to be getting insurance soon and will return for fasting labs and physical and EKG.  We will also consider baseline echocardiogram at that time  Gout, elevated uric acid-continue allopurinol but return soon for fasting labs and physical  Obesity-congratulated him on his recent weight loss and healthy diet and  exercise changes  Psoriasis- discussed over-the-counter creams that he could use for management  Rash-possible allergic rash of the forearms that could be related to gluten or other new trigger.  Begin triamcinolone short-term for the next 1 to 2 weeks, but avoid use on face, begin over-the-counter antihistamine.  If not improved in the next 2 weeks then call or return   Colin Sparks was seen today for follow up.  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 -     lisinopril (ZESTRIL) 20 MG tablet; Take 1 tablet (20 mg total) by mouth daily. -     allopurinol (ZYLOPRIM) 300 MG tablet; Take 1 tablet (300 mg total) by mouth daily.   Follow up: within 48mo for fasting physical

## 2019-03-21 NOTE — Patient Instructions (Signed)
Exercise: I recommend exercising most days of the week using a type of exercise that you would enjoy and stick to such as walking, running, swimming, hiking, biking, aerobics, etc. This needs to be at least 30-40 minutes at a time, at least 5 days/week with moderate intensity.  This would be 60-70% of your maximum heart rate.  For example, your maximal heart rate would be 200- your age, multiplied x 0.6 to equal 60% of your maximal heart rate.    Thus, a person age 40 would have a maximum heart rate of 160.  60% of this would be 96 beats per minute.  Thus for fat burning exercise, a 40-year-old would want to exercise has sustained heart rate of about 96 bpm for fat burning.  However for heart health, they would want to exercise at a higher heart rate of about 75% of maximum heart rate which would be a heart rate of about 120 beats per minute.  So I recommend a combination of doing some exercise at moderate intensity, and some exercise at vigorous intensity for heart health.   Low Carb Diet recommendations:  I recommend you drink water throughout the day.   70 ounces or 2 liters would be a good amount.  If you have been accustomed to drinking juice or soda, try water with lemon or water with lime, or try using no calorie flavor dropper.  I recommend the following as an example meal plan that includes 3 meals per day.   You can skip some meals periodically for intermittent fasting.  Breakfast (choose one): . Omelette with egg, can include a little bit of cheese, your choice of mushrooms, peppers, onions, salsa . Smoothie with handful of spinach or kale, 1 cup of milk or water, 1 cup of berries, 1 packet of artificial sweetener such as stevia . Yogurt with fruit   Mid morning snack: . 1 fruit serving and 1 protein such as 8 almonds or 8 nuts, or vegetable such as carrots and humus or other similar vegetable   Lunch: . Salad with 3-4 ounces of lean grilled or baked meat such as fish, chicken or  turkey   Mid afternoon snack: . 1 fruit serving and 1 protein such as 8 almonds or 8 nuts, or vegetable such as carrots and humus or other similar vegetable   Dinner: . Large serving of vegetables and 3-4 ounces of lean grilled or baked meat such as fish, chicken or turkey . Or vegetarian dish without meat    Avoid  . Chips, cookies, cake, donuts, soda, sweet tea, juices, candy, fast food . For now , avoid, or significantly limit grains  

## 2019-07-29 ENCOUNTER — Other Ambulatory Visit: Payer: Self-pay | Admitting: Medical

## 2019-07-29 ENCOUNTER — Telehealth: Payer: Self-pay

## 2019-07-29 MED ORDER — LISINOPRIL 20 MG PO TABS: 20.0000 mg | ORAL_TABLET | Freq: Every day | ORAL | 1 refills | Status: DC

## 2019-07-29 MED ORDER — LISINOPRIL 20 MG PO TABS: 20.0000 mg | ORAL_TABLET | Freq: Every day | ORAL | 0 refills | Status: DC

## 2019-07-29 NOTE — Telephone Encounter (Signed)
He is due for physical, fasting labs now.  Please schedule.  Med sent

## 2019-07-29 NOTE — Telephone Encounter (Signed)
Pt. Mom called stating that he needs a refill on his Lisinopril he has been with out for 2 days, pt. Mom said we can call the pt. Back if we need to. Pt. [facility] [address] If he needs to come in for apt. he can come next week M,T, or W, if needed, wasn't sure if he was [facility] pt. Or [facility]. Pt. Mom would like medication called in before the weekend at least enough to get him to next apt. If he needs one scheduled.

## 2019-08-01 NOTE — Telephone Encounter (Signed)
I called pt. And let him know Lisinopril was sent in and that he needs to scheduled a CPE with fasting labs. Had to LM.

## 2019-08-13 ENCOUNTER — Other Ambulatory Visit: Payer: Self-pay | Admitting: Medical

## 2020-01-16 ENCOUNTER — Other Ambulatory Visit: Payer: Self-pay | Admitting: Family Medicine

## 2020-03-19 ENCOUNTER — Encounter: Payer: Self-pay | Admitting: Family Medicine

## 2020-03-20 ENCOUNTER — Encounter: Payer: Self-pay | Admitting: Family Medicine

## 2020-07-11 ENCOUNTER — Other Ambulatory Visit: Payer: Self-pay

## 2020-07-11 ENCOUNTER — Encounter: Payer: Self-pay | Admitting: Family Medicine

## 2020-07-11 ENCOUNTER — Ambulatory Visit (INDEPENDENT_AMBULATORY_CARE_PROVIDER_SITE_OTHER): Payer: Self-pay | Admitting: Family Medicine

## 2020-07-11 VITALS — BP 190/110 | HR 91 | Temp 97.7°F | Wt 195.0 lb

## 2020-07-11 DIAGNOSIS — I10 Essential (primary) hypertension: Secondary | ICD-10-CM

## 2020-07-11 DIAGNOSIS — M109 Gout, unspecified: Secondary | ICD-10-CM

## 2020-07-11 DIAGNOSIS — E669 Obesity, unspecified: Secondary | ICD-10-CM

## 2020-07-11 MED ORDER — LISINOPRIL 20 MG PO TABS: 20.0000 mg | ORAL_TABLET | Freq: Every day | ORAL | 0 refills | Status: DC

## 2020-07-11 MED ORDER — ALLOPURINOL 300 MG PO TABS: 300.0000 mg | ORAL_TABLET | Freq: Every day | ORAL | 0 refills | Status: DC

## 2020-07-11 NOTE — Progress Notes (Signed)
   Subjective:    Patient ID: Colin Sparks, male    DOB: 1991-05-31, 28 y.o.   MRN: [id number]  HPI He is here for consult concerning his blood pressure.  He ran out of his medication several weeks ago and did not call for refill.  He also ran out of his allopurinol.  He has not had any breakthrough gout attacks.  He now has a new job but does not have insurance at the present time. He also notes that he has gained some weight since he made lifestyle changes but not in best direction.  Review of Systems     Objective:   Physical Exam Alert and in no distress.  Blood pressure is recorded.       Assessment & Plan:  Essential hypertension, benign - Plan: lisinopril (ZESTRIL) 20 MG tablet  Gout, unspecified cause, unspecified chronicity, unspecified site - Plan: allopurinol (ZYLOPRIM) 300 MG tablet  Obesity with serious comorbidity, unspecified classification, unspecified obesity type He is to set up an appointment for when he gets insurance for probably 2 months down the road.  Encouraged him to continue with making diet and exercise changes.  Cautioned about stopping medications abruptly, especially the gout meds abruptly.Marland Kitchen

## 2020-12-04 ENCOUNTER — Other Ambulatory Visit: Payer: Self-pay | Admitting: Family Medicine

## 2020-12-04 ENCOUNTER — Telehealth: Payer: Self-pay | Admitting: Internal Medicine

## 2020-12-04 DIAGNOSIS — I10 Essential (primary) hypertension: Secondary | ICD-10-CM

## 2020-12-04 MED ORDER — LISINOPRIL 20 MG PO TABS: 20.0000 mg | ORAL_TABLET | Freq: Every day | ORAL | 0 refills | Status: DC

## 2020-12-04 NOTE — Telephone Encounter (Signed)
Sent in med to pharmacy 

## 2020-12-04 NOTE — Telephone Encounter (Signed)
LVM to find out if pt had enough med to last until his appt 12/06/20. No answer on cell will send in for 30 days just in case. KH

## 2020-12-06 ENCOUNTER — Ambulatory Visit: Payer: Self-pay | Admitting: Family Medicine

## 2020-12-13 ENCOUNTER — Ambulatory Visit: Payer: Self-pay | Admitting: Family Medicine

## 2020-12-17 ENCOUNTER — Encounter: Payer: Self-pay | Admitting: Family Medicine

## 2020-12-26 ENCOUNTER — Telehealth: Payer: Self-pay

## 2020-12-26 ENCOUNTER — Other Ambulatory Visit: Payer: Self-pay | Admitting: Family Medicine

## 2020-12-26 DIAGNOSIS — I10 Essential (primary) hypertension: Secondary | ICD-10-CM

## 2020-12-26 NOTE — Telephone Encounter (Signed)
Called pt to advise that he needs to schedule soon and this is the last thirty day supply . KH

## 2021-01-17 ENCOUNTER — Other Ambulatory Visit: Payer: Self-pay | Admitting: Family Medicine

## 2021-01-17 DIAGNOSIS — I10 Essential (primary) hypertension: Secondary | ICD-10-CM

## 2021-01-17 NOTE — Telephone Encounter (Signed)
Left detailed message that Dr. Susann Givens wanted to see him before refilling medication again.

## 2021-02-20 ENCOUNTER — Telehealth: Payer: Self-pay

## 2021-02-20 DIAGNOSIS — I10 Essential (primary) hypertension: Secondary | ICD-10-CM

## 2021-02-20 NOTE — Telephone Encounter (Signed)
Pt. Wife called stating she scheduled him for an apt Tuesday because he needs a refill on his BP med lisinopril. He is actually out of it now and wanted to know if you could call in a short time supply to last him until Tuesday. [facility] [address]

## 2021-02-21 MED ORDER — LISINOPRIL 20 MG PO TABS: 20.0000 mg | ORAL_TABLET | Freq: Every day | ORAL | 0 refills | Status: DC

## 2021-02-26 ENCOUNTER — Other Ambulatory Visit: Payer: Self-pay

## 2021-02-26 ENCOUNTER — Encounter: Payer: Self-pay | Admitting: Family Medicine

## 2021-02-26 ENCOUNTER — Ambulatory Visit: Payer: Self-pay | Admitting: Family Medicine

## 2021-02-26 VITALS — BP 194/130 | HR 96 | Temp 97.6°F | Ht 66.0 in | Wt 210.2 lb

## 2021-02-26 DIAGNOSIS — R9431 Abnormal electrocardiogram [ECG] [EKG]: Secondary | ICD-10-CM

## 2021-02-26 DIAGNOSIS — I10 Essential (primary) hypertension: Secondary | ICD-10-CM

## 2021-02-26 DIAGNOSIS — E790 Hyperuricemia without signs of inflammatory arthritis and tophaceous disease: Secondary | ICD-10-CM

## 2021-02-26 DIAGNOSIS — E669 Obesity, unspecified: Secondary | ICD-10-CM

## 2021-02-26 MED ORDER — LISINOPRIL-HYDROCHLOROTHIAZIDE 20-12.5 MG PO TABS: 1.0000 | ORAL_TABLET | Freq: Every day | ORAL | 3 refills | Status: DC

## 2021-02-26 NOTE — Progress Notes (Signed)
   Subjective:    Patient ID: Colin Sparks, male    DOB: 1991/02/28, 30 y.o.   MRN: [id number]  HPI He is here for a recheck on his blood pressure.  He was seen in November for his blood pressure and told to come back in 2 months which she did not.  In the meantime he has lost his job and is now looking for another one.  He does not have insurance.  He has had no difficulty with chest pain, shortness of breath, PND or DOE.  No family history of heart disease.   Review of Systems     Objective:   Physical Exam Alert and in no distress.  Cardiac exam shows regular rhythm without murmurs or gallops.  EKG read by me shows a rate of 76 with other parameters normal.  Poor R wave progression is noted as well as a ST-T changes in the lateral leads.       Assessment & Plan:  Essential hypertension, benign - Plan: EKG 12-Lead, CBC with Differential/Platelet, Comprehensive metabolic panel, Lipid panel, Ambulatory referral to Cardiology, lisinopril-hydrochlorothiazide (ZESTORETIC) 20-12.5 MG tablet  Obesity with serious comorbidity, unspecified classification, unspecified obesity type  Elevated uric acid in blood - Plan: Uric Acid  Abnormal EKG - Plan: EKG 12-Lead, CBC with Differential/Platelet, Comprehensive metabolic panel, Lipid panel, Ambulatory referral to Cardiology I explained that his EKG is definitely abnormal from his previous reading and that further cardiology evaluation would be appropriate.  We will also change his blood pressure medication and recheck here in 1 month or with cardiology depending on which is sooner.

## 2021-02-27 LAB — CBC WITH DIFFERENTIAL/PLATELET
Basophils Absolute: 0.1 10*3/uL (ref 0.0–0.2)
Basos: 1 %
EOS (ABSOLUTE): 0.6 10*3/uL — ABNORMAL HIGH (ref 0.0–0.4)
Eos: 5 %
Hematocrit: 54.2 % — ABNORMAL HIGH (ref 37.5–51.0)
Hemoglobin: 18.6 g/dL — ABNORMAL HIGH (ref 13.0–17.7)
Immature Grans (Abs): 0 10*3/uL (ref 0.0–0.1)
Immature Granulocytes: 0 %
Lymphocytes Absolute: 3 10*3/uL (ref 0.7–3.1)
Lymphs: 25 %
MCH: 29.3 pg (ref 26.6–33.0)
MCHC: 34.3 g/dL (ref 31.5–35.7)
MCV: 85 fL (ref 79–97)
Monocytes Absolute: 0.8 10*3/uL (ref 0.1–0.9)
Monocytes: 7 %
Neutrophils Absolute: 7.2 10*3/uL — ABNORMAL HIGH (ref 1.4–7.0)
Neutrophils: 62 %
Platelets: 361 10*3/uL (ref 150–450)
RBC: 6.35 x10E6/uL — ABNORMAL HIGH (ref 4.14–5.80)
RDW: 12.8 % (ref 11.6–15.4)
WBC: 11.8 10*3/uL — ABNORMAL HIGH (ref 3.4–10.8)

## 2021-02-27 LAB — COMPREHENSIVE METABOLIC PANEL
ALT: 26 IU/L (ref 0–44)
AST: 25 IU/L (ref 0–40)
Albumin/Globulin Ratio: 1.5 (ref 1.2–2.2)
Albumin: 4.5 g/dL (ref 4.0–5.0)
Alkaline Phosphatase: 104 IU/L (ref 44–121)
BUN/Creatinine Ratio: 11 (ref 9–20)
BUN: 14 mg/dL (ref 6–20)
Bilirubin Total: 0.7 mg/dL (ref 0.0–1.2)
CO2: 23 mmol/L (ref 20–29)
Calcium: 9.8 mg/dL (ref 8.7–10.2)
Chloride: 101 mmol/L (ref 96–106)
Creatinine, Ser: 1.33 mg/dL — ABNORMAL HIGH (ref 0.76–1.27)
Globulin, Total: 3 g/dL (ref 1.5–4.5)
Glucose: 100 mg/dL — ABNORMAL HIGH (ref 65–99)
Potassium: 4.4 mmol/L (ref 3.5–5.2)
Sodium: 140 mmol/L (ref 134–144)
Total Protein: 7.5 g/dL (ref 6.0–8.5)
eGFR: 73 mL/min/{1.73_m2} (ref >59)

## 2021-02-27 LAB — LIPID PANEL
Chol/HDL Ratio: 5.8 ratio — ABNORMAL HIGH (ref 0.0–5.0)
Cholesterol, Total: 209 mg/dL — ABNORMAL HIGH (ref 100–199)
HDL: 36 mg/dL — ABNORMAL LOW (ref >39)
LDL Chol Calc (NIH): 117 mg/dL — ABNORMAL HIGH (ref 0–99)
Triglycerides: 320 mg/dL — ABNORMAL HIGH (ref 0–149)
VLDL Cholesterol Cal: 56 mg/dL — ABNORMAL HIGH (ref 5–40)

## 2021-02-28 LAB — SPECIMEN STATUS REPORT

## 2021-02-28 LAB — URIC ACID: Uric Acid: 8.5 mg/dL — ABNORMAL HIGH (ref 3.8–8.4)

## 2021-03-05 NOTE — Progress Notes (Signed)
Mailed  letter KH 

## 2021-03-05 NOTE — Progress Notes (Signed)
Mailed letter. Kh

## 2021-03-15 ENCOUNTER — Other Ambulatory Visit: Payer: Self-pay | Admitting: Family Medicine

## 2021-03-15 DIAGNOSIS — I10 Essential (primary) hypertension: Secondary | ICD-10-CM

## 2021-03-15 NOTE — Telephone Encounter (Signed)
Pt is now on combo medication

## 2021-04-02 ENCOUNTER — Ambulatory Visit: Payer: Self-pay | Admitting: Family Medicine

## 2021-04-16 ENCOUNTER — Ambulatory Visit: Payer: Self-pay | Admitting: Family Medicine

## 2022-02-20 ENCOUNTER — Other Ambulatory Visit: Payer: Self-pay | Admitting: Family Medicine

## 2022-02-20 DIAGNOSIS — I10 Essential (primary) hypertension: Secondary | ICD-10-CM

## 2022-02-20 NOTE — Telephone Encounter (Signed)
Pt was called to schedule an appointment with Dr. Susann Givens. KH

## 2022-03-03 ENCOUNTER — Encounter: Payer: Self-pay | Admitting: Family Medicine

## 2022-03-03 ENCOUNTER — Ambulatory Visit (INDEPENDENT_AMBULATORY_CARE_PROVIDER_SITE_OTHER): Payer: Self-pay | Admitting: Family Medicine

## 2022-03-03 VITALS — BP 180/120 | HR 71 | Temp 98.6°F | Ht 66.0 in | Wt 219.2 lb

## 2022-03-03 DIAGNOSIS — I10 Essential (primary) hypertension: Secondary | ICD-10-CM

## 2022-03-03 DIAGNOSIS — R9431 Abnormal electrocardiogram [ECG] [EKG]: Secondary | ICD-10-CM

## 2022-03-03 DIAGNOSIS — E669 Obesity, unspecified: Secondary | ICD-10-CM

## 2022-03-03 DIAGNOSIS — E785 Hyperlipidemia, unspecified: Secondary | ICD-10-CM

## 2022-03-03 MED ORDER — AMLODIPINE BESYLATE 5 MG PO TABS: 5.0000 mg | ORAL_TABLET | Freq: Every day | ORAL | 0 refills | Status: DC

## 2022-03-03 MED ORDER — LISINOPRIL-HYDROCHLOROTHIAZIDE 20-12.5 MG PO TABS: 1.0000 | ORAL_TABLET | Freq: Every day | ORAL | 0 refills | Status: DC

## 2022-03-03 NOTE — Progress Notes (Signed)
   Subjective:    Patient ID: Colin Sparks, male    DOB: [birth year], 31 y.o.   MRN: [id number]  HPI He is here for blood pressure recheck.  Since his last visit in July of last year, he did not follow-up with blood pressure reading here nor did he go to cardiology.  Apparently his mother has been checking his blood pressure at home and he states that they have been in the 150/100 range.   Review of Systems     Objective:   Physical Exam Alert and in no distress.  Cardiac exam shows regular rhythm without murmurs or gallops.  Lungs are clear to auscultation.  EKG read by me shows a rate of 67 with poor R wave progression and some ST-T changes.       Assessment & Plan:  Essential hypertension, benign - Plan: lisinopril-hydrochlorothiazide (ZESTORETIC) 20-12.5 MG tablet, amLODipine (NORVASC) 5 MG tablet  Abnormal EKG - Plan: CBC with Differential/Platelet, Comprehensive metabolic panel, Lipid panel  Obesity with serious comorbidity, unspecified classification, unspecified obesity type - Plan: CBC with Differential/Platelet, Comprehensive metabolic panel, Lipid panel I again stressed the fact that I would like him to see cardiology however he does not have finances to handle it at this time.  He is to return here in 1 month for a recheck.  Discussed reading about the DASH diet and getting involved in a regular exercise program.

## 2022-03-04 ENCOUNTER — Encounter: Payer: Self-pay | Admitting: Family Medicine

## 2022-03-04 LAB — CBC WITH DIFFERENTIAL/PLATELET
Basophils Absolute: 0.2 10*3/uL (ref 0.0–0.2)
Basos: 1 %
EOS (ABSOLUTE): 0.7 10*3/uL — ABNORMAL HIGH (ref 0.0–0.4)
Eos: 4 %
Hematocrit: 54.4 % — ABNORMAL HIGH (ref 37.5–51.0)
Hemoglobin: 18.4 g/dL — ABNORMAL HIGH (ref 13.0–17.7)
Immature Grans (Abs): 0.1 10*3/uL (ref 0.0–0.1)
Immature Granulocytes: 1 %
Lymphocytes Absolute: 3.1 10*3/uL (ref 0.7–3.1)
Lymphs: 20 %
MCH: 28.6 pg (ref 26.6–33.0)
MCHC: 33.8 g/dL (ref 31.5–35.7)
MCV: 85 fL (ref 79–97)
Monocytes Absolute: 0.9 10*3/uL (ref 0.1–0.9)
Monocytes: 6 %
Neutrophils Absolute: 10.1 10*3/uL — ABNORMAL HIGH (ref 1.4–7.0)
Neutrophils: 68 %
Platelets: 354 10*3/uL (ref 150–450)
RBC: 6.43 x10E6/uL — ABNORMAL HIGH (ref 4.14–5.80)
RDW: 13.8 % (ref 11.6–15.4)
WBC: 15 10*3/uL — ABNORMAL HIGH (ref 3.4–10.8)

## 2022-03-04 LAB — COMPREHENSIVE METABOLIC PANEL
ALT: 27 IU/L (ref 0–44)
AST: 20 IU/L (ref 0–40)
Albumin/Globulin Ratio: 1.5 (ref 1.2–2.2)
Albumin: 4.6 g/dL (ref 4.1–5.1)
Alkaline Phosphatase: 87 IU/L (ref 44–121)
BUN/Creatinine Ratio: 13 (ref 9–20)
BUN: 14 mg/dL (ref 6–20)
Bilirubin Total: 0.6 mg/dL (ref 0.0–1.2)
CO2: 23 mmol/L (ref 20–29)
Calcium: 10 mg/dL (ref 8.7–10.2)
Chloride: 97 mmol/L (ref 96–106)
Creatinine, Ser: 1.04 mg/dL (ref 0.76–1.27)
Globulin, Total: 3 g/dL (ref 1.5–4.5)
Glucose: 86 mg/dL (ref 70–99)
Potassium: 4.4 mmol/L (ref 3.5–5.2)
Sodium: 138 mmol/L (ref 134–144)
Total Protein: 7.6 g/dL (ref 6.0–8.5)
eGFR: 97 mL/min/{1.73_m2} (ref >59)

## 2022-03-04 LAB — LIPID PANEL
Chol/HDL Ratio: 6.4 ratio — ABNORMAL HIGH (ref 0.0–5.0)
Cholesterol, Total: 251 mg/dL — ABNORMAL HIGH (ref 100–199)
HDL: 39 mg/dL — ABNORMAL LOW (ref >39)
LDL Chol Calc (NIH): 110 mg/dL — ABNORMAL HIGH (ref 0–99)
Triglycerides: 586 mg/dL (ref 0–149)
VLDL Cholesterol Cal: 102 mg/dL — ABNORMAL HIGH (ref 5–40)

## 2022-03-04 MED ORDER — ATORVASTATIN CALCIUM 20 MG PO TABS: 20.0000 mg | ORAL_TABLET | Freq: Every day | ORAL | 3 refills | Status: DC

## 2022-03-04 NOTE — Addendum Note (Signed)
Addended by: Ronnald Nian on: 03/04/2022 07:58 AM   Modules accepted: Orders

## 2022-03-05 NOTE — Addendum Note (Signed)
Addended by: Ronnald Nian on: 03/05/2022 10:36 AM   Modules accepted: Orders

## 2022-03-06 ENCOUNTER — Encounter: Payer: Self-pay | Admitting: Family Medicine

## 2022-03-31 ENCOUNTER — Ambulatory Visit (INDEPENDENT_AMBULATORY_CARE_PROVIDER_SITE_OTHER): Payer: Self-pay | Admitting: Family Medicine

## 2022-03-31 VITALS — BP 142/92 | HR 69 | Temp 98.2°F | Wt 212.4 lb

## 2022-03-31 DIAGNOSIS — I10 Essential (primary) hypertension: Secondary | ICD-10-CM

## 2022-03-31 NOTE — Progress Notes (Signed)
   Subjective:    Patient ID: Colin Sparks, male    DOB: 1991/01/12, 31 y.o.   MRN: [id number]  HPI He is here for recheck.  He has been taking lisinopril/HCTZ but did stop taking the amlodipine.  He states when he was taking that he did have some dizziness symptoms that sound like postural hypotension.  Further discussion indicates his father had the same issue and had to stop amlodipine because of the same problem.  He has increased his physical activity.  He has been checking his blood pressure at home with his mother who is a nurse and did have pressure of 130/90 at home.   Review of Systems     Objective:   Physical Exam Alert and in no distress.  Blood pressure is recorded.       Assessment & Plan:  Essential hypertension, benign I will continue on his present medication regimen and stop the amlodipine.  Since he has lost some weight, I will wait a couple months and see if continued weight loss, dietary modification including using the Dash diet is more effective. Return here in 2 months

## 2022-04-30 ENCOUNTER — Encounter: Payer: Self-pay | Admitting: Internal Medicine

## 2022-05-30 ENCOUNTER — Other Ambulatory Visit: Payer: Self-pay | Admitting: Family Medicine

## 2022-05-30 DIAGNOSIS — I10 Essential (primary) hypertension: Secondary | ICD-10-CM

## 2022-06-03 ENCOUNTER — Encounter: Payer: Self-pay | Admitting: Internal Medicine

## 2022-06-16 ENCOUNTER — Encounter: Payer: Self-pay | Admitting: Internal Medicine

## 2022-07-01 ENCOUNTER — Other Ambulatory Visit: Payer: Self-pay | Admitting: Family Medicine

## 2022-07-01 DIAGNOSIS — M109 Gout, unspecified: Secondary | ICD-10-CM

## 2023-05-27 ENCOUNTER — Encounter: Payer: Self-pay | Admitting: Family Medicine

## 2023-05-27 ENCOUNTER — Ambulatory Visit (INDEPENDENT_AMBULATORY_CARE_PROVIDER_SITE_OTHER): Payer: Self-pay | Admitting: Family Medicine

## 2023-05-27 VITALS — BP 140/110 | HR 75 | Ht 66.0 in | Wt 207.0 lb

## 2023-05-27 DIAGNOSIS — Z23 Encounter for immunization: Secondary | ICD-10-CM

## 2023-05-27 DIAGNOSIS — E785 Hyperlipidemia, unspecified: Secondary | ICD-10-CM

## 2023-05-27 DIAGNOSIS — E669 Obesity, unspecified: Secondary | ICD-10-CM

## 2023-05-27 DIAGNOSIS — I10 Essential (primary) hypertension: Secondary | ICD-10-CM

## 2023-05-27 MED ORDER — ATORVASTATIN CALCIUM 20 MG PO TABS: 20.0000 mg | ORAL_TABLET | Freq: Every day | ORAL | 3 refills | Status: AC

## 2023-05-27 MED ORDER — LISINOPRIL-HYDROCHLOROTHIAZIDE 20-12.5 MG PO TABS: 1.0000 | ORAL_TABLET | Freq: Every day | ORAL | 3 refills | Status: AC

## 2023-05-27 NOTE — Progress Notes (Signed)
   Subjective:    Patient ID: Colin Sparks, male    DOB: 29-Nov-1990, 32 y.o.   MRN: [id number]  HPI He is here for an interval evaluation.  He has been his medications for the last several months.  He lost his job and did become quite depressed over this but states that he has made some changes in his life to help deal with this more effectively.  He has not gotten involved in counseling but does state that he is doing better. He is actively looking for another job.  Review of Systems     Objective:    Physical Exam Alert and in no distress otherwise not examined       Assessment & Plan:   Problem List Items Addressed This Visit     Essential hypertension, benign - Primary   Relevant Medications   atorvastatin (LIPITOR) 20 MG tablet   lisinopril-hydrochlorothiazide (ZESTORETIC) 20-12.5 MG tablet   Other Visit Diagnoses     Obesity (BMI 30-39.9)       Hyperlipidemia, unspecified hyperlipidemia type       Relevant Medications   atorvastatin (LIPITOR) 20 MG tablet   lisinopril-hydrochlorothiazide (ZESTORETIC) 20-12.5 MG tablet     I will place him back on his medications.  Recommend that he come here after he gets another job.  He will probably attempt to get on Medicaid.  Return here in 1 month for recheck.

## 2023-05-27 NOTE — Patient Instructions (Signed)
Call family and children services for counseling

## 2023-07-01 ENCOUNTER — Encounter: Payer: Self-pay | Admitting: Family Medicine

## 2023-07-01 ENCOUNTER — Ambulatory Visit (INDEPENDENT_AMBULATORY_CARE_PROVIDER_SITE_OTHER): Payer: Self-pay | Admitting: Family Medicine

## 2023-07-01 VITALS — BP 138/96 | HR 70 | Wt 213.2 lb

## 2023-07-01 DIAGNOSIS — I10 Essential (primary) hypertension: Secondary | ICD-10-CM

## 2023-07-01 MED ORDER — AMLODIPINE BESYLATE 5 MG PO TABS: 5.0000 mg | ORAL_TABLET | Freq: Every day | ORAL | 3 refills | Status: DC

## 2023-07-01 NOTE — Progress Notes (Signed)
   Subjective:    Patient ID: Colin Sparks, male    DOB: 22-Nov-1990, 32 y.o.   MRN: [id number]  HPI He is here for recheck.  He has been taking the blood pressure medication as well as atorvastatin without difficulty.  He apparently has a new job and should be getting insurance soon.   Review of Systems     Objective:    Physical Exam Alert and in no distress.  Blood pressure is recorded.       Assessment & Plan:  Essential hypertension, benign - Plan: amLODipine (NORVASC) 5 MG tablet He is to continue on the lisinopril and actually add amlodipine to this.  I will check him in a couple months when he has insurance.  He was comfortable with.

## 2023-10-20 ENCOUNTER — Encounter: Payer: Self-pay | Admitting: Internal Medicine

## 2024-03-11 ENCOUNTER — Encounter: Payer: Self-pay | Admitting: Advanced Practice Midwife

## 2024-03-28 ENCOUNTER — Ambulatory Visit: Payer: Self-pay

## 2024-03-28 NOTE — Telephone Encounter (Signed)
 FYI Only or Action Required?: FYI only for provider.  Patient was last seen in primary care on 07/01/2023 by Joyce Norleen BROCKS, MD.  Called Nurse Triage reporting Foot Pain.  Symptoms began several days ago.  Interventions attempted: OTC medications: Aleve  and Ice/heat application.  Symptoms are: gradually worsening.  Triage Disposition: See Physician Within 24 Hours (overriding See HCP Within 4 Hours (Or PCP Triage))  Patient/caregiver understands and will follow disposition?: Yes                             Copied from CRM #[id number]. Topic: Clinical - Red Word Triage >> Mar 28, 2024  2:28 PM Paige D wrote: Red Word that prompted transfer to Nurse Triage: Left big toe is severely swollen mom says its very bad Gout Arthritis, mom is stating is the flare up is very bad had it for about week and it wont go away and is getting worse. Reason for Disposition  [1] SEVERE pain (e.g., excruciating, unable to do any normal activities) AND [2] not improved after 2 hours of pain medicine  Answer Assessment - Initial Assessment Questions 1. ONSET: When did the pain start?      Wednesday of last week 2. LOCATION: Where is the pain located?      Started in right ankle and left big toe 3. PAIN: How bad is the pain?    (Scale 1-10; or mild, moderate, severe)     States patient acted like the pain was pretty severe when she saw him over the weekend 4. WORK OR EXERCISE: Has there been any recent work or exercise that involved this part of the body?      Denies  5. CAUSE: What do you think is causing the foot pain?     Gout  6. OTHER SYMPTOMS: Do you have any other symptoms? (e.g., leg pain, rash, fever, numbness)     Redness, swelling, denies numbness, denies fever, denies rash    Spoke to patient's mother, Louis. Patient not available for a call at this time. Mother is not listed on DPR, so this RN was careful not to release medical information. Scheduled for  first available appointment in office tomorrow.  Protocols used: Foot Pain-A-AH

## 2024-03-29 ENCOUNTER — Encounter: Payer: Self-pay | Admitting: Family Medicine

## 2024-03-29 ENCOUNTER — Ambulatory Visit (INDEPENDENT_AMBULATORY_CARE_PROVIDER_SITE_OTHER): Payer: Self-pay | Admitting: Family Medicine

## 2024-03-29 VITALS — BP 140/70 | HR 75 | Ht 66.0 in | Wt 214.4 lb

## 2024-03-29 DIAGNOSIS — E785 Hyperlipidemia, unspecified: Secondary | ICD-10-CM

## 2024-03-29 DIAGNOSIS — M1000 Idiopathic gout, unspecified site: Secondary | ICD-10-CM

## 2024-03-29 DIAGNOSIS — I10 Essential (primary) hypertension: Secondary | ICD-10-CM

## 2024-03-29 MED ORDER — ALLOPURINOL 100 MG PO TABS
100.0000 mg | ORAL_TABLET | Freq: Every day | ORAL | 6 refills | Status: DC
Start: 2024-03-29 — End: 2024-06-30

## 2024-03-29 MED ORDER — COLCHICINE 0.6 MG PO TABS
0.6000 mg | ORAL_TABLET | Freq: Two times a day (BID) | ORAL | 1 refills | Status: DC
Start: 2024-03-29 — End: 2024-04-05

## 2024-03-29 NOTE — Progress Notes (Signed)
   Subjective:    Patient ID: Colin Sparks, male    DOB: Aug 17, 1991, 33 y.o.   MRN: [id number]  HPI He is here for follow-up after swelling and tenderness of the right great toe.  Review of the record indicates he has been previously treated with allopurinol  for gout.  He no longer has insurance.  He does plan to try and get insurance.  He has had previous flares of the gout mainly revolving around dietary indiscretion.  He was on allopurinol  in the past.  He has been using 2 Aleve  twice per day and does get some relief from this.  He also supposed to be on statin as well as blood pressure medication and apparently he does have enough from last year's prescriptions.   Review of Systems     Objective:    Physical Exam Alert and in no distress.  Foot exam of the right great toe does show swelling over the DIP joint however minimal tenderness to palpation and some erythema is noted but does not feel hot.       Assessment & Plan:  Idiopathic gout, unspecified chronicity, unspecified site - Plan: colchicine  0.6 MG tablet, allopurinol  (ZYLOPRIM ) 100 MG tablet  Essential hypertension, benign  Hyperlipidemia, unspecified hyperlipidemia type I will start him at a low dose allopurinol  and also give colchicine  and continue to use of the Aleve .  Discussed getting insurance so we can do further evaluation and treatment for his underlying hypertension as well as hyperlipidemia.

## 2024-04-05 ENCOUNTER — Other Ambulatory Visit: Payer: Self-pay | Admitting: Family Medicine

## 2024-04-05 DIAGNOSIS — M1000 Idiopathic gout, unspecified site: Secondary | ICD-10-CM

## 2024-04-13 ENCOUNTER — Other Ambulatory Visit: Payer: Self-pay

## 2024-04-13 ENCOUNTER — Other Ambulatory Visit: Payer: Self-pay | Admitting: Family Medicine

## 2024-04-13 DIAGNOSIS — M1000 Idiopathic gout, unspecified site: Secondary | ICD-10-CM

## 2024-05-18 ENCOUNTER — Other Ambulatory Visit: Payer: Self-pay | Admitting: Family Medicine

## 2024-05-18 DIAGNOSIS — M1000 Idiopathic gout, unspecified site: Secondary | ICD-10-CM

## 2024-06-14 ENCOUNTER — Other Ambulatory Visit: Payer: Self-pay | Admitting: Family Medicine

## 2024-06-14 DIAGNOSIS — M1000 Idiopathic gout, unspecified site: Secondary | ICD-10-CM

## 2024-06-29 ENCOUNTER — Ambulatory Visit (INDEPENDENT_AMBULATORY_CARE_PROVIDER_SITE_OTHER): Payer: Self-pay | Admitting: Family Medicine

## 2024-06-29 ENCOUNTER — Encounter: Payer: Self-pay | Admitting: Family Medicine

## 2024-06-29 VITALS — BP 158/96 | HR 89 | Ht 66.5 in | Wt 205.8 lb

## 2024-06-29 DIAGNOSIS — I10 Essential (primary) hypertension: Secondary | ICD-10-CM

## 2024-06-29 DIAGNOSIS — M1A0711 Idiopathic chronic gout, right ankle and foot, with tophus (tophi): Secondary | ICD-10-CM

## 2024-06-29 MED ORDER — AMLODIPINE BESYLATE 5 MG PO TABS: 5.0000 mg | ORAL_TABLET | Freq: Every day | ORAL | 3 refills | Status: AC

## 2024-06-29 NOTE — Progress Notes (Signed)
   Subjective:    Patient ID: Colin Sparks, male    DOB: 01-17-1991, 33 y.o.   MRN: [id number]  HPI He is here for a recheck.  He did run out of amlodipine  and I will renew this.  He continues on the lisinopril .  He is also taking 100 mg of allopurinol  and occasionally will use colchicine .  He has no other concerns.     Review of Systems     Objective:    Physical Exam Alert and in no distress.  Exam of the right great toe does show tophaceous deposit to the medial aspect of the great toe.  Blood pressure is recorded.       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

## 2024-06-30 ENCOUNTER — Ambulatory Visit: Payer: Self-pay | Admitting: Family Medicine

## 2024-06-30 LAB — URIC ACID: Uric Acid: 7.3 mg/dL (ref 3.8–8.4)

## 2024-06-30 MED ORDER — ALLOPURINOL 300 MG PO TABS: 300.0000 mg | ORAL_TABLET | Freq: Every day | ORAL | 1 refills | Status: AC

## 2024-06-30 NOTE — Addendum Note (Signed)
 Addended by: JOYCE NORLEEN BROCKS on: 06/30/2024 12:37 PM   Modules accepted: Orders
